# Patient Record
Sex: Male | Born: 1990 | Race: White | Hispanic: No | Marital: Single | Smoking: Never smoker
Health system: Southern US, Community
[De-identification: ages and names within clinical notes are randomized; demographics above are authoritative.]

## PROBLEM LIST (undated history)

## (undated) DIAGNOSIS — T7840XA Allergy, unspecified, initial encounter: Secondary | ICD-10-CM

## (undated) HISTORY — DX: Allergy, unspecified, initial encounter: T78.40XA

---
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
## 2008-01-23 IMAGING — CR DG FOREARM 2V*L*
1 series · 2 of 2 positions shown · non-contrast
Comparison: none

REASON FOR EXAM: pain
COMMENTS:

PROCEDURE:     KDR - KDXR FOREARM LEFT  - January 13, 2007  [DATE]
RESULT:     Comparison: No available comparison exam.

[Series 2: view not recorded · 0.17mm/px · 2 of 2 slices shown]
[im 1/2]
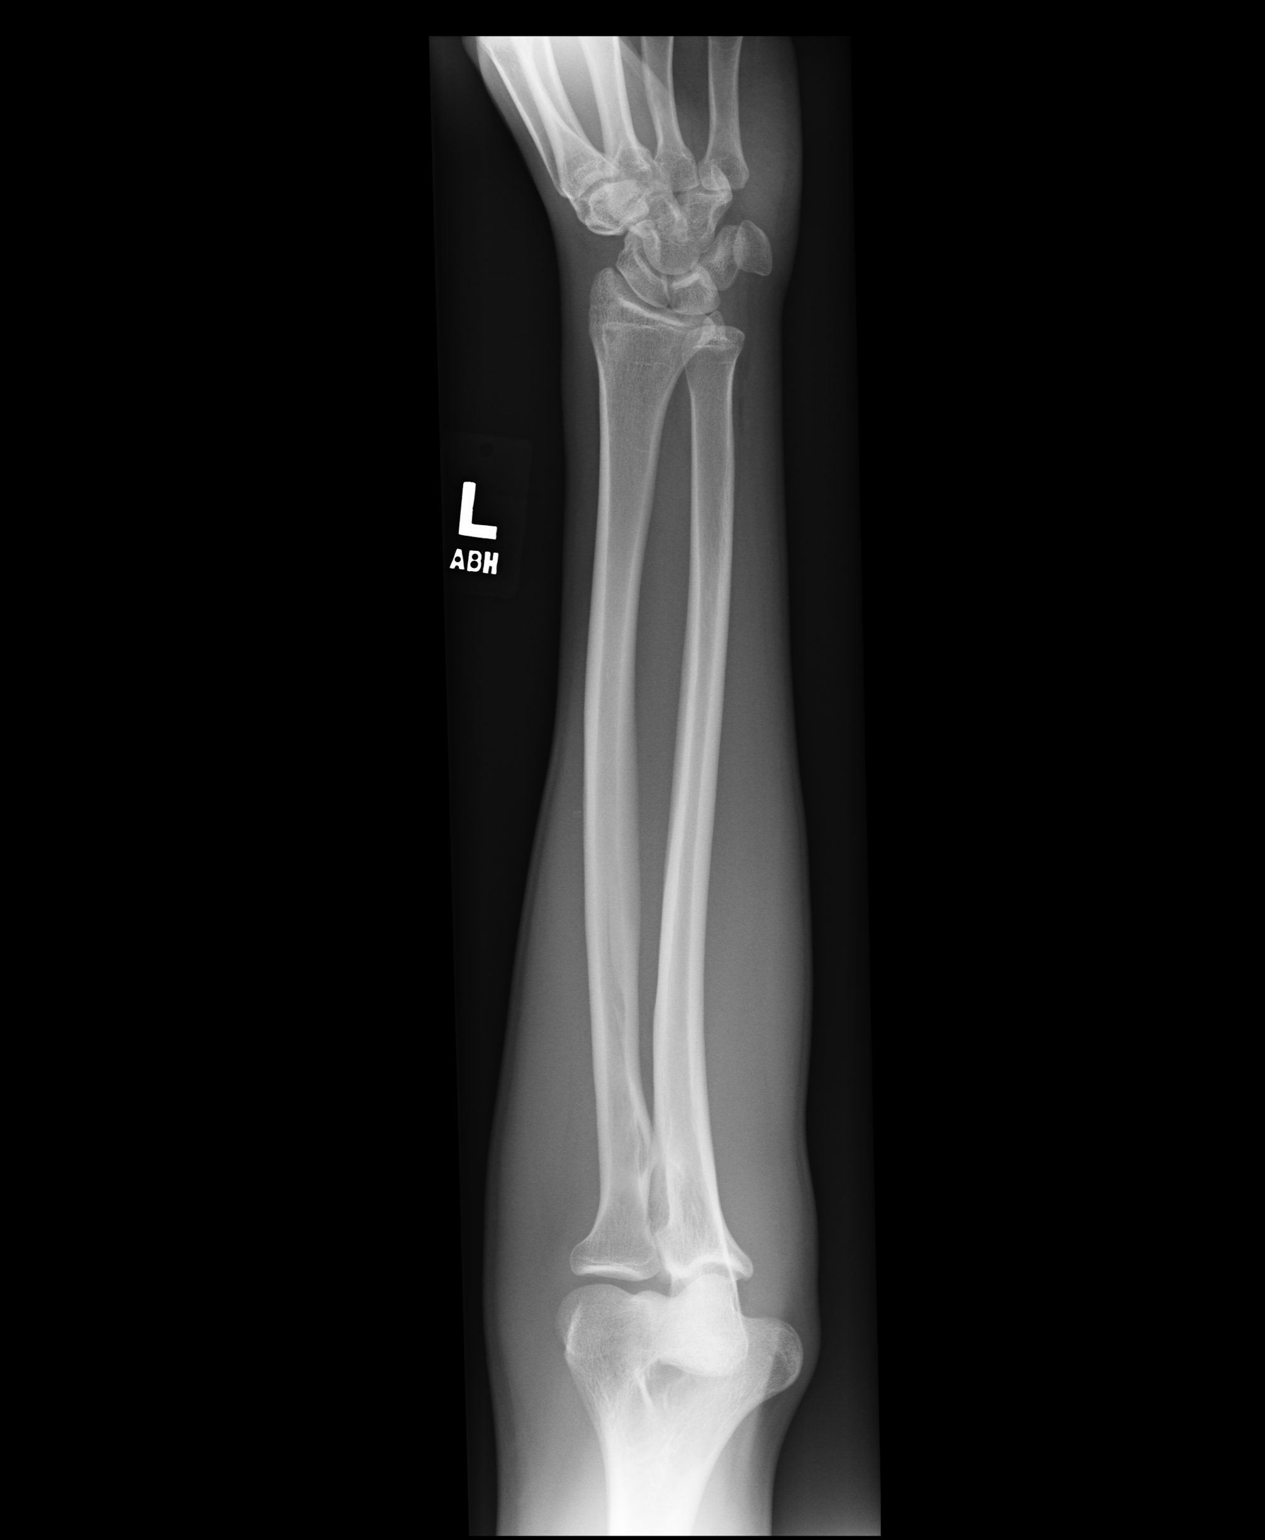
[im 2/2]
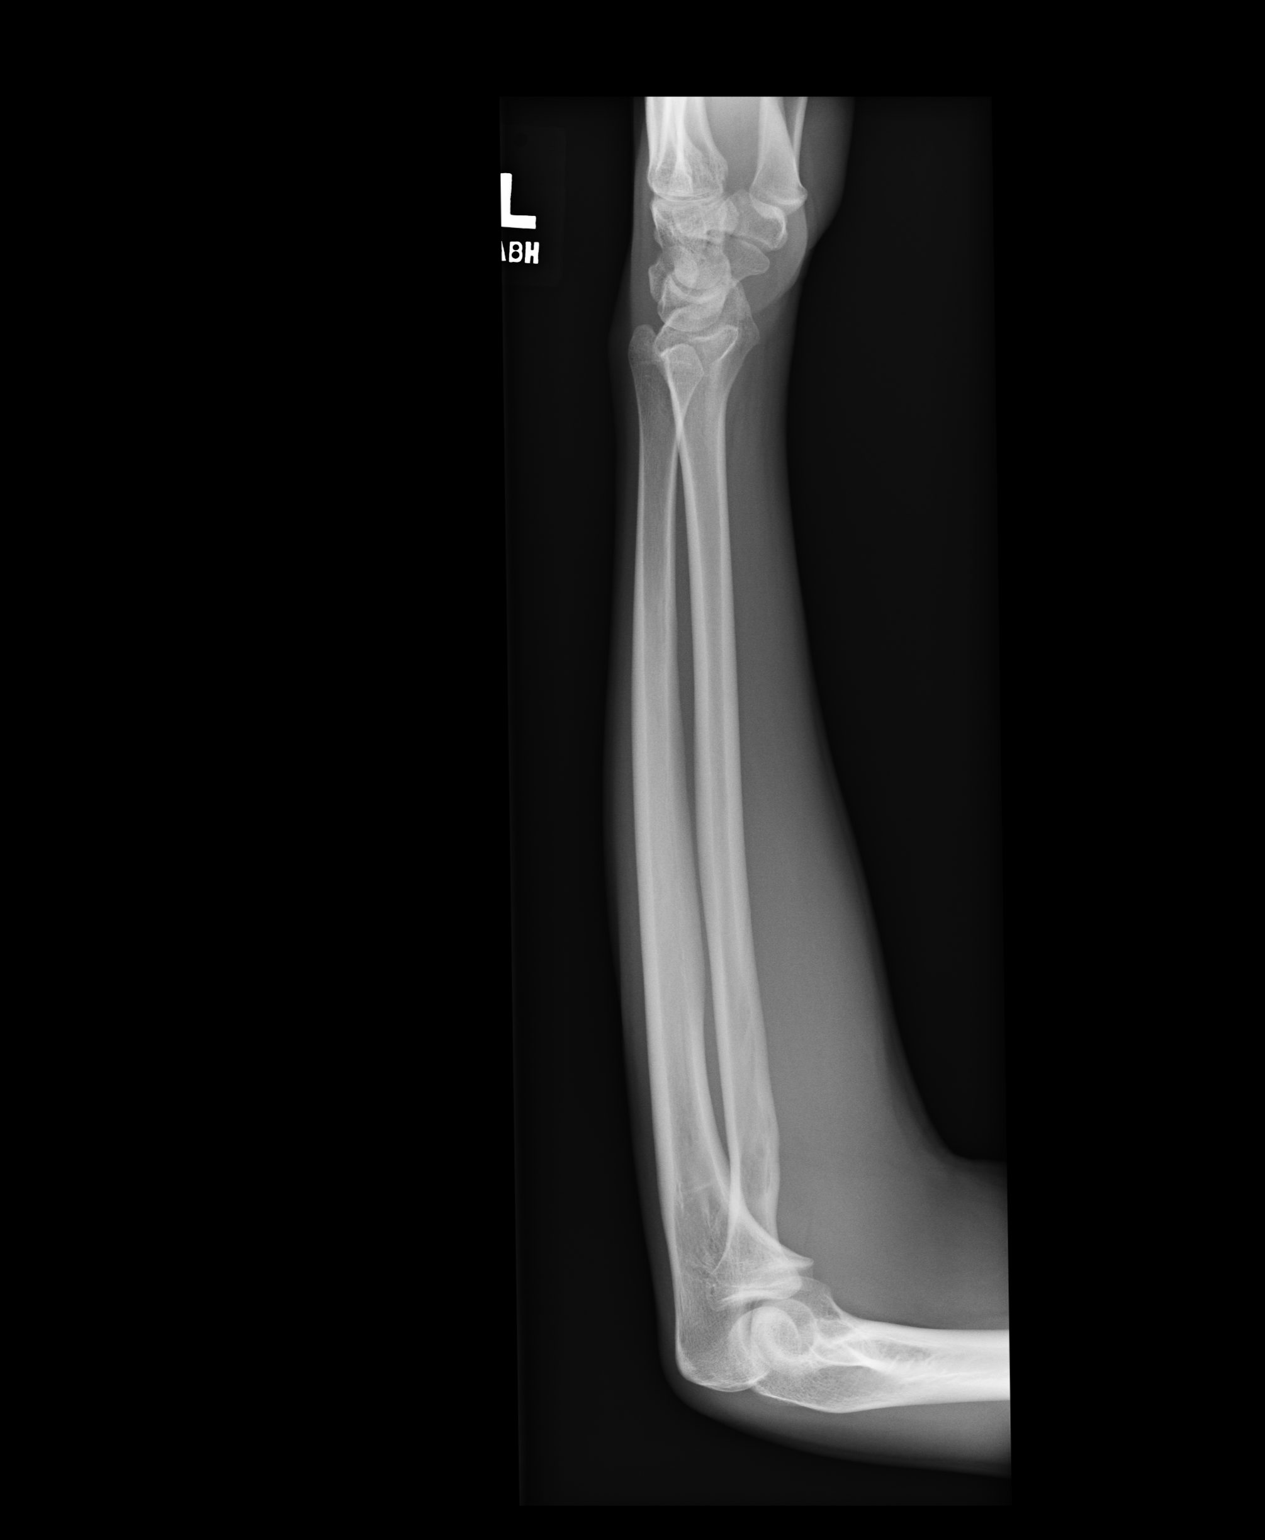

[2 of 2 positions shown; findings below may reference images not displayed]

FINDINGS: Two views of the left forearm were obtained.

No fracture or dislocation of the left forearm is noted.
IMPRESSION: 1. No fracture or dislocation of the left forearm is noted.

## 2008-11-08 IMAGING — US US THYROID
1 series · 17 of 25 positions shown · non-contrast
Comparison: none

REASON FOR EXAM: goiter
COMMENTS:

[Series 1: us thyroid · 17 of 27 slices shown]
[im 1/27]
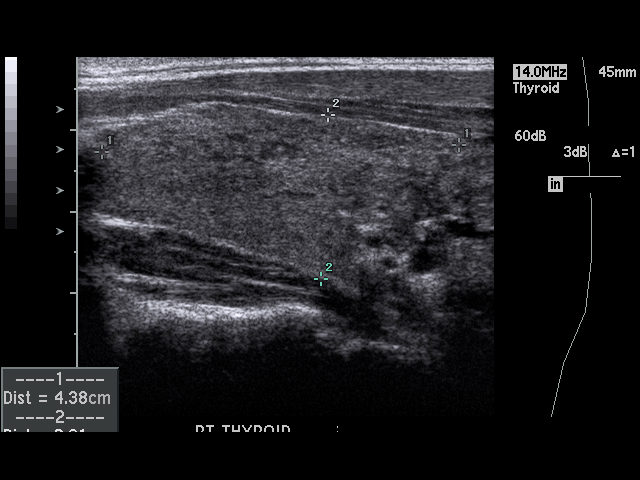
[im 3/27]
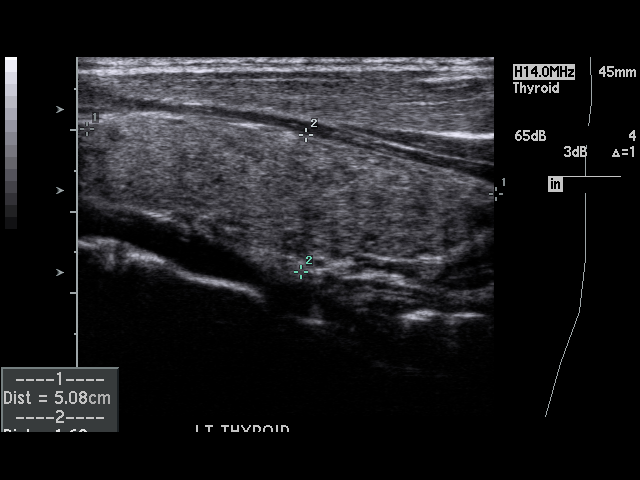
[im 4/27]
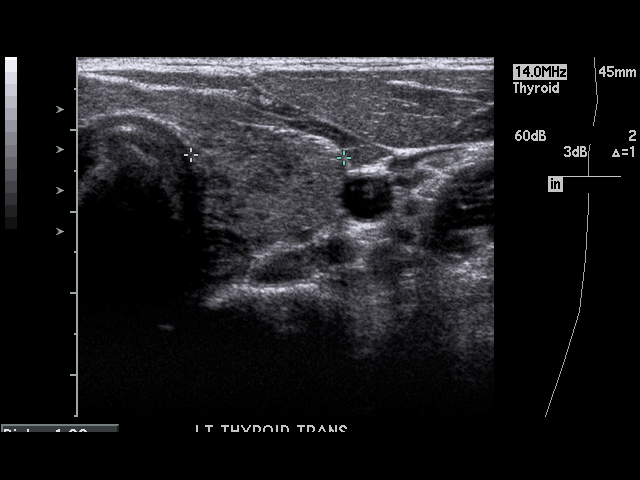
[im 6/27]
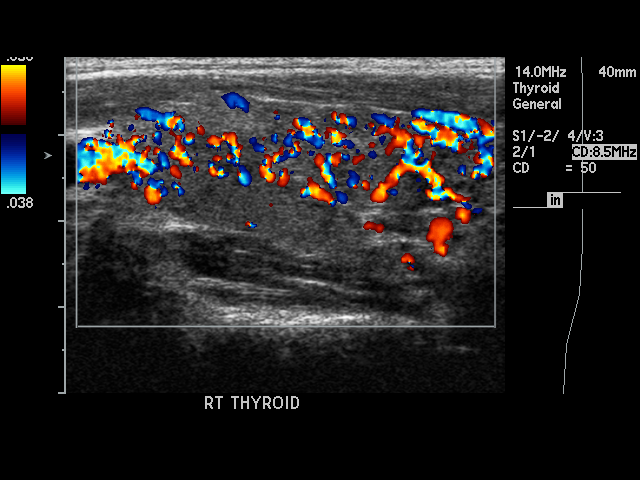
[im 7/27]
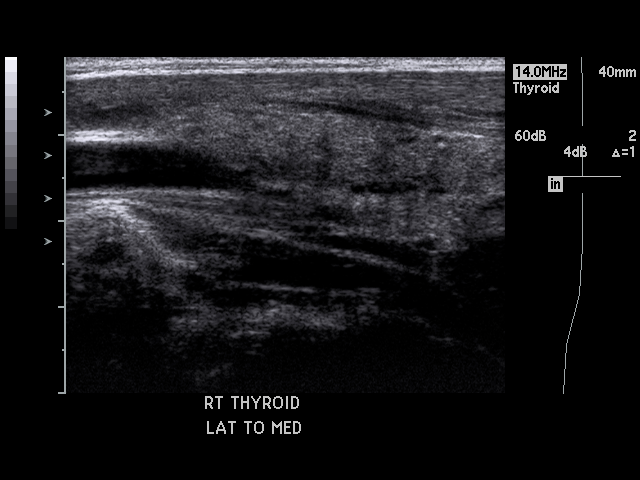
[im 9/27]
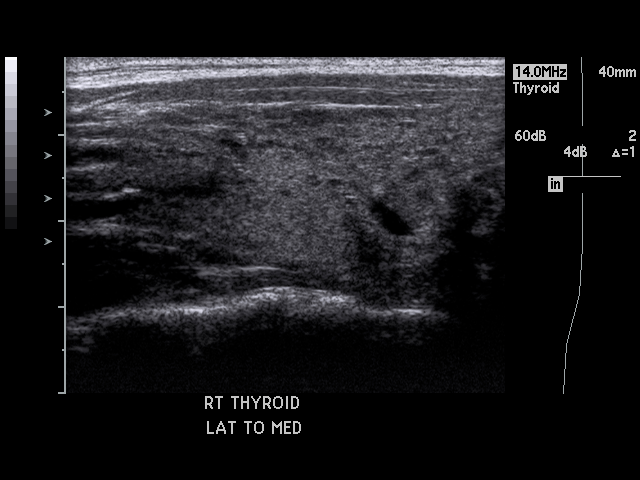
[im 10/27]
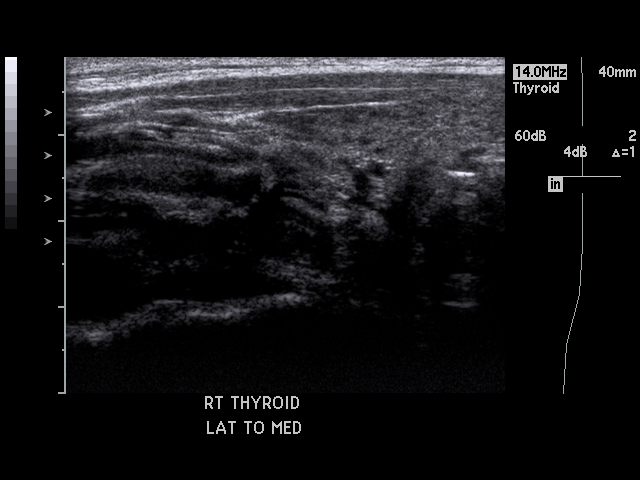
[im 12/27]
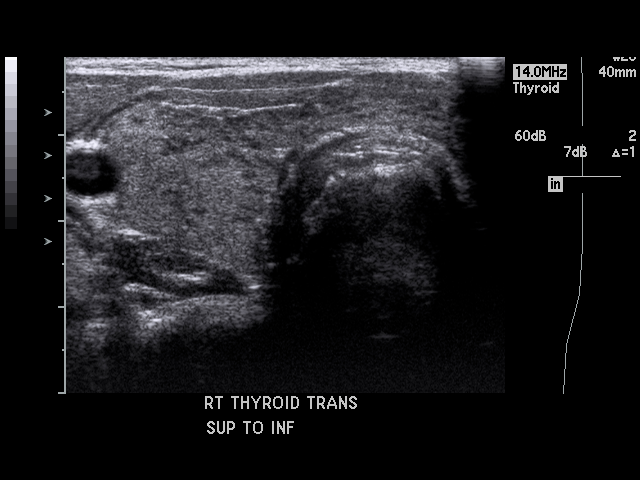
[im 14/27]
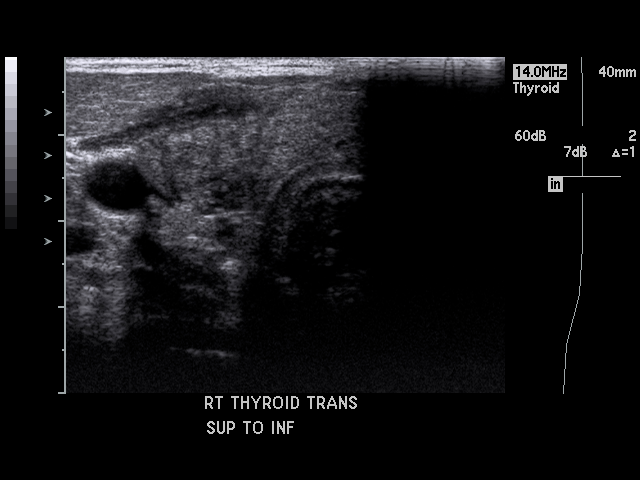
[im 15/27]
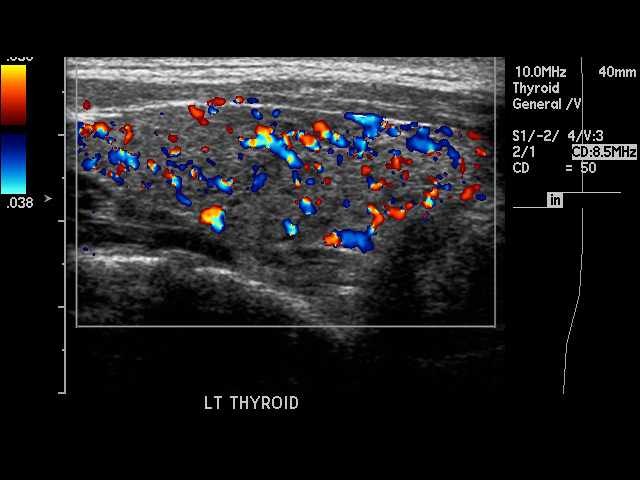
[im 17/27]
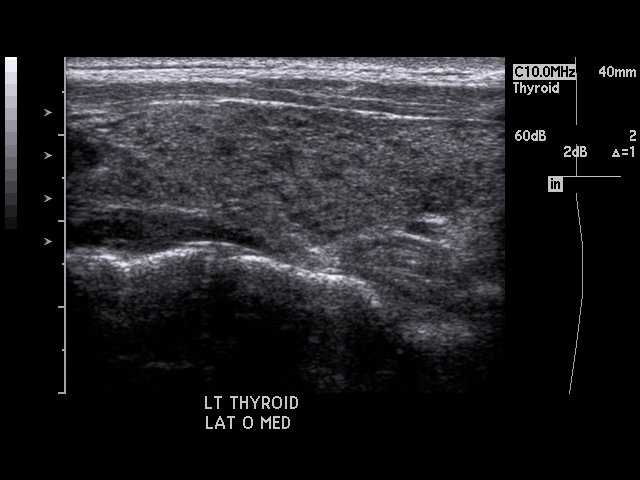
[im 18/27]
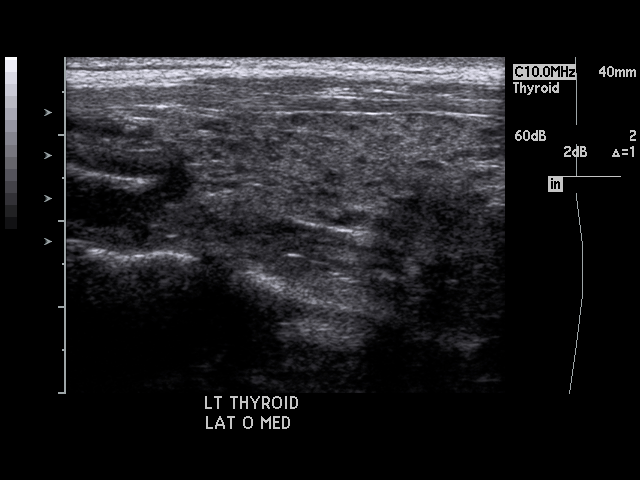
[im 20/27]
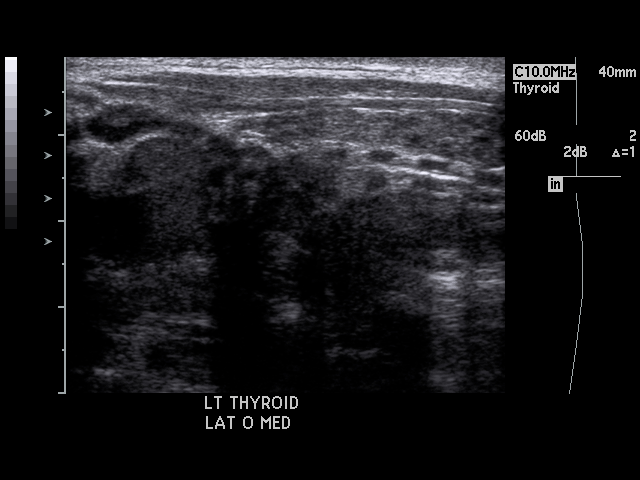
[im 21/27]
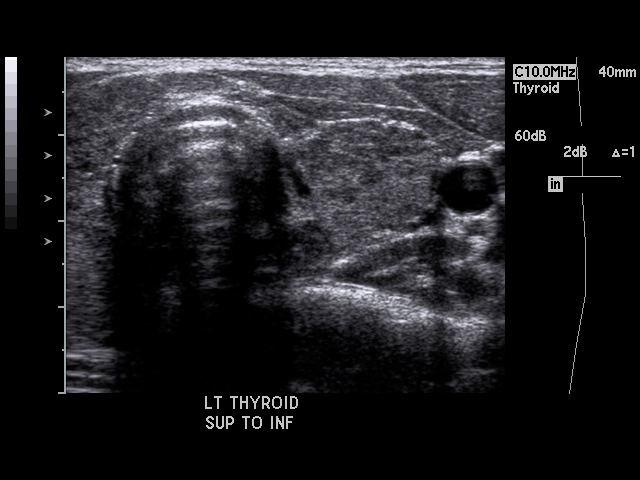
[im 23/27]
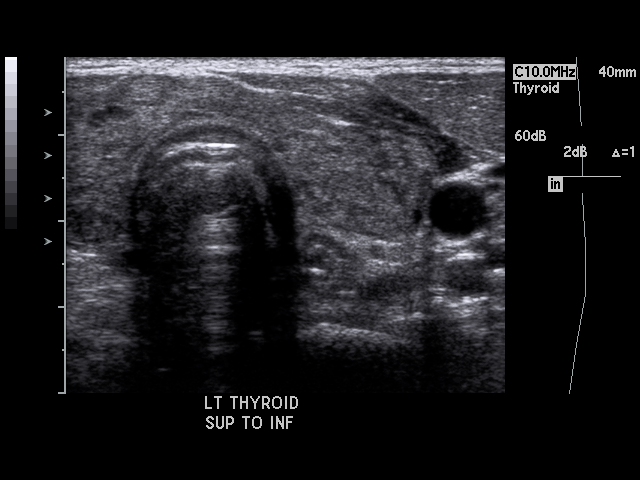
[im 24/27]
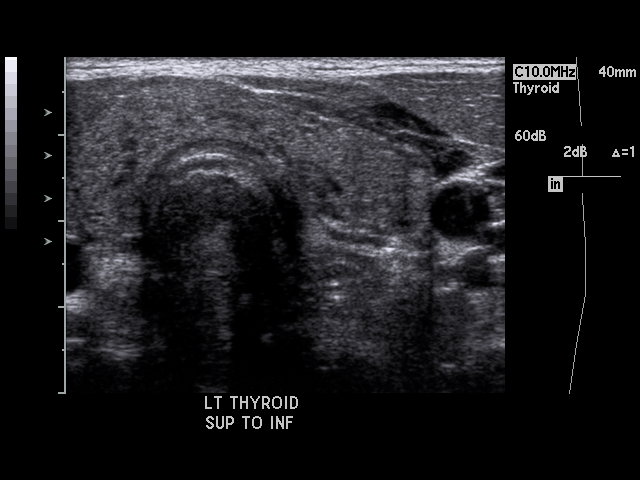
[im 27/27]
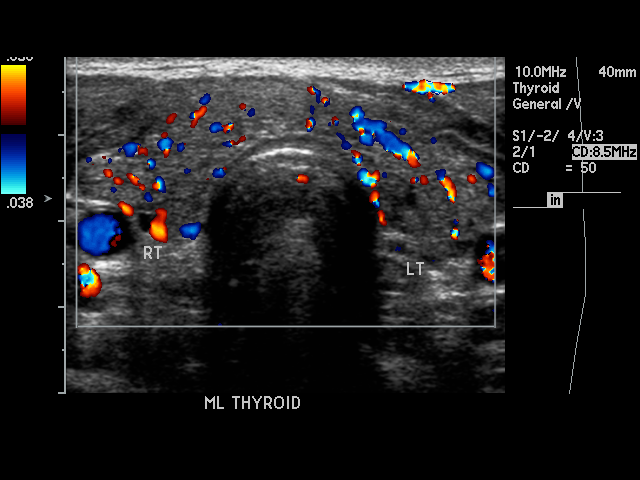

[17 of 25 positions shown; findings below may reference images not displayed]

PROCEDURE:     US  - US THYROID  - October 30, 2007 [DATE]

RESULT:     The thyroid gland is heterogeneous in echotexture with no
dominant nodules identified. The tissue is hypervascular. On the RIGHT, the
thyroid lobe measures 4.4 x 2.0 x 1.7 cm. On the LEFT, the lobe measures
x 1.7 x 1.9 cm. The thyroid isthmus measures 0.6 cm in diameter.
IMPRESSION: The thyroid gland is heterogeneous in its echotexture and
demonstrates increased vascularity. I see no dominant mass.

## 2010-10-07 ENCOUNTER — Emergency Department: Payer: Self-pay | Admitting: Emergency Medicine

## 2013-11-30 ENCOUNTER — Emergency Department: Payer: Self-pay | Admitting: Emergency Medicine

## 2014-11-17 ENCOUNTER — Emergency Department: Payer: Self-pay

## 2014-11-17 ENCOUNTER — Emergency Department
Admission: EM | Admit: 2014-11-17 | Discharge: 2014-11-17 | Disposition: A | Discharge status: Home / Self Care | Payer: Self-pay | Attending: Emergency Medicine | Admitting: Emergency Medicine

## 2014-11-17 DIAGNOSIS — I861 Scrotal varices: Secondary | ICD-10-CM

## 2014-11-17 DIAGNOSIS — S3994XA Unspecified injury of external genitals, initial encounter: Secondary | ICD-10-CM | POA: Insufficient documentation

## 2014-11-17 DIAGNOSIS — Z880 Allergy status to penicillin: Secondary | ICD-10-CM | POA: Insufficient documentation

## 2014-11-17 DIAGNOSIS — Y998 Other external cause status: Secondary | ICD-10-CM | POA: Insufficient documentation

## 2014-11-17 DIAGNOSIS — Y9289 Other specified places as the place of occurrence of the external cause: Secondary | ICD-10-CM | POA: Insufficient documentation

## 2014-11-17 DIAGNOSIS — E119 Type 2 diabetes mellitus without complications: Secondary | ICD-10-CM | POA: Insufficient documentation

## 2014-11-17 DIAGNOSIS — W2209XA Striking against other stationary object, initial encounter: Secondary | ICD-10-CM | POA: Insufficient documentation

## 2014-11-17 DIAGNOSIS — N50811 Right testicular pain: Secondary | ICD-10-CM

## 2014-11-17 DIAGNOSIS — Y9389 Activity, other specified: Secondary | ICD-10-CM | POA: Insufficient documentation

## 2014-11-17 NOTE — ED Provider Notes (Signed)
[facility] Emergency Department Provider Note ____________________________________________  Time seen: 1622  I have reviewed the triage vital signs and the nursing notes.  HISTORY  Chief Complaint  Groin Swelling  HPI Hector Marsh is a 24 y.o. male reports to the ED for evaluation of firmness to the right testicle after recent injury. He describes that about 6 days prior he was working under his car, when a 3/4 inch ratchet fell hitting him in the crotch. He noted immediate pain to the area, within the next few days began to note firmness and swelling to the right testicle. He is been taking Tylenol at appropriate intermittently for pain relief. He denies any dysuria, hematuria, bruising, or redness to the testicles. He is here today for evaluation of some ongoing right testicular fullness.He rates his pain at a 2/10 in triage.  Past Medical History  Diagnosis Date  . Diabetes mellitus without complication (HCC)    There are no active problems to display for this patient.  History reviewed. No pertinent past surgical history.  No current outpatient prescriptions on file.  Allergies Erythromycin and Penicillins  History reviewed. No pertinent family history.  Social History Social History  Substance Use Topics  . Smoking status: Never Smoker   . Smokeless tobacco: None  . Alcohol Use: No   Review of Systems  Constitutional: Negative for fever. Eyes: Negative for visual changes. ENT: Negative for sore throat. Cardiovascular: Negative for chest pain. Respiratory: Negative for shortness of breath. Gastrointestinal: Negative for abdominal pain, vomiting and diarrhea. Genitourinary: Negative for dysuria. Musculoskeletal: Negative for back pain. Skin: Negative for rash. Neurological: Negative for headaches, focal weakness or numbness. ____________________________________________  PHYSICAL EXAM:  VITAL SIGNS: ED Triage Vitals  Enc Vitals  Group     BP 11/17/14 1545 141/81 mmHg     Pulse Rate 11/17/14 1545 98     Resp 11/17/14 1545 16     Temp 11/17/14 1545 98.4 F (36.9 C)     Temp src --      SpO2 11/17/14 1545 98 %     Weight 11/17/14 1545 155 lb (70.308 kg)     Height 11/17/14 1545  (1.803 m)     Head Cir --      Peak Flow --      Pain Score 11/17/14 1546 2     Pain Loc --      Pain Edu? --      Excl. in GC? --    Constitutional: Alert and oriented. Well appearing and in no distress. Head: Normocephalic and atraumatic.      Eyes: Conjunctivae are normal. PERRL. Normal extraocular movements      Ears: Canals clear. TMs intact bilaterally.   Nose: No congestion/rhinorrhea.   Mouth/Throat: Mucous membranes are moist.   Neck: Supple. No thyromegaly. Hematological/Lymphatic/Immunological: No cervical lymphadenopathy. Cardiovascular: Normal rate, regular rhythm.  Respiratory: Normal respiratory effort. No wheezes/rales/rhonchi. Gastrointestinal: Soft and nontender. No distention. GU: Scotum without abrasion, laceration, ecchymosis, or lesion. The right testicle with a palpable, smooth firmness overlying the testis. No inguinal fullness or hernia appreciated.  Musculoskeletal: Nontender with normal range of motion in all extremities.  Neurologic:  Normal gait without ataxia. Normal speech and language. No gross focal neurologic deficits are appreciated. Skin:  Skin is warm, dry and intact. No rash noted. Psychiatric: Mood and affect are normal. Patient exhibits appropriate insight and judgment. ____________________________________________   RADIOLOGY Testicular Doppler Ultrasound  IMPRESSION: 1. No evidence of acute or subacute testicular  injury. 2. No evidence of testicular torsion. 3. No evidence of epididymo-orchitis. 4. Bilateral varicoceles, right greater than left, with prominent visible pampiniform plexus superior to the right  testicle. ____________________________________________  INITIAL IMPRESSION / ASSESSMENT AND PLAN / ED COURSE  Reassurance to the patient regarding the ultrasound findings. 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 He will be discharged with instructions to continue his ibuprofen and Tylenol dosing as well as ice compresses to promote healing. He is to follow with urology as needed for any ongoing symptoms. ____________________________________________  FINAL CLINICAL IMPRESSION(S) / ED DIAGNOSES  Final diagnoses:  Varicocele present on ultrasound of scrotum  Testicular pain, right      Lissa HoardJenise V Bacon Othelia Riederer, PA-C 11/18/14 0033  Darien Ramusavid W Kaminski, MD 11/22/14 1357

## 2014-11-17 NOTE — ED Notes (Signed)
Patient hit in testicles with ratchet from work last week. Patient comes in today with complaints of right testicle swelling and continued pain.

## 2014-11-17 NOTE — Discharge Instructions (Signed)
Varicocele A varicocele is a swelling of veins in the scrotum. The scrotum is the sac that contains the testicles. Varicoceles can occur on either side of the scrotum, but they are more common on the left side. They occur most often in teenage boys and young men. In most cases, varicoceles are not a serious problem. They are usually small and painless and do not require treatment. Tests may be done to confirm the diagnosis. Treatment may be needed if:  A varicocele is large, causes a lot of pain, or causes pain when exercising.  Varicoceles are found on both sides of the scrotum.  The testicle on the opposite side is absent or not normal.  A varicocele causes a decrease in the size of the testicle in a growing adolescent.  The person has fertility problems. CAUSES This condition is the result of valves in the veins not working properly. Valves in the veins help to return blood from the scrotum and testicles to the heart. If these valves do not work well, blood flows backward and backs up into the veins, which causes the veins to swell. This is similar to what happens when varicose veins form in the leg. SYMPTOMS Most varicoceles do not cause any symptoms. If symptoms do occur, they may include:  Swelling on one side of the scrotum. The swelling may be more obvious when you are standing up.  A lumpy feeling in the scrotum.  A heavy feeling on one side of the scrotum.  A dull ache in the scrotum, especially after exercise or prolonged standing or sitting.  Slower growth or reduced size of the testicle on the side of the varicocele (in young males).  Problems with fertility. These can occur if the testicle does not grow normally. DIAGNOSIS This condition may be diagnosed with a physical exam. You may also have an imaging test, called an ultrasound, to confirm the diagnosis and to help rule out other causes of the swelling. TREATMENT Treatment is usually not needed for this condition. If  you have any pain, your health care provider may prescribe or recommend medicine to help relieve it. You may need regular exams so your health care provider can monitor the varicocele to ensure that it does not cause problems. When further treatment is needed, it may involve one of these options:  Varicocelectomy. This is a surgery in which the swollen veins are tied off so that the flow of blood goes to other veins instead.  Embolization. In this procedure, a small tube (catheter) is used to place metal coils or other blocking items in the veins. This cuts off the blood flow to the swollen veins. HOME CARE INSTRUCTIONS  Take medicines only as directed by your health care provider.  Wear supportive underwear.  Use an athletic supporter for sports.  Keep all follow-up visits as directed by your health care provider. This is important. SEEK MEDICAL CARE IF:  Your pain is increasing.  You have redness in the affected area.  You have swelling that does not decrease when you are lying down.  One of your testicles is smaller than the other.  Your testicle becomes enlarged, swollen, or painful.   This information is not intended to replace advice given to you by your health care provider. Make sure you discuss any questions you have with your health care provider.   Document Released: 04/09/2000 Document Revised: 05/18/2014 Document Reviewed: 12/09/2013 Elsevier Interactive Patient Education 2016 Else[employer] Inc[employer]  Scrotal Swelling Scrotal swelling may occur  on one or both sides of the scrotum. Pain may also occur with swelling. Possible causes of scrotal swelling include:   Injury.  Infection.  An ingrown hair or abrasion in the area.  Repeated rubbing from tight-fitting underwear.  Poor hygiene.  A weakened area in the muscles around the groin (hernia). A hernia can allow abdominal contents to push into the scrotum.  Fluid around the testicle (hydrocele).  Enlarged vein around  the testicle (varicocele).  Certain medical treatments or existing conditions.  A recent genital surgery or procedure.  The spermatic cord becomes twisted in the scrotum, which cuts off blood supply (testicular torsion).  Testicular cancer. HOME CARE INSTRUCTIONS Once the cause of your scrotal swelling has been determined, you may be asked to monitor your scrotum for any changes. The following actions may help to alleviate any discomfort you are experiencing:  Rest and limit activity until the swelling goes away. Lying down is the preferred position.  Put ice on the scrotum:  Put ice in a plastic bag.  Place a towel between your skin and the bag.  Leave the ice on for 20 minutes, 2-3 times a day for 1-2 days.  Place a rolled towel under the testicles for support.  Wear loose-fitting clothing or an athletic support cup for comfort.  Take all medicines as directed by your health care provider.  Perform a monthly self-exam of the scrotum and penis. Feel for changes. Ask your health care provider how to perform a monthly self-exam if you are unsure. SEEK MEDICAL CARE IF:  You have a sudden (acute) onset of pain that is persistent and not improving.  You notice a heavy feeling or fluid in the scrotum.  You have pain or burning while urinating.  You have blood in the urine or semen.  You feel a lump around the testicle.  You notice that one testicle is larger than the other (slight variation is normal).  You have a persistent dull ache or pain in the groin or scrotum. SEEK IMMEDIATE MEDICAL CARE IF:  The pain does not go away or becomes severe.  You have a fever or shaking chills.  You have pain or vomiting that cannot be controlled.  You notice significant redness or swelling of one or both sides of the scrotum.  You experience redness spreading upward from your scrotum to your abdomen or downward from your scrotum to your thighs. MAKE SURE YOU:  Understand these  instructions.  Will watch your condition.  Will get help right away if you are not doing well or get worse.   This information is not intended to replace advice given to you by your health care provider. Make sure you discuss any questions you have with your health care provider.   Document Released: 02/03/2010 Document Revised: 09/03/2012 Document Reviewed: 06/05/2012 Elsevier Interactive Patient Education [employer]2016 [employer]  Your ultrasound shows a benign (common, not harmful) swelling to your testicle. You should continue to apply ice compresses and take pain medicine as needed. Follow-up with Dr. Sherron MondayMacDiarmid as needed.

## 2015-03-26 ENCOUNTER — Emergency Department: Payer: Self-pay

## 2015-03-26 ENCOUNTER — Emergency Department
Admission: EM | Admit: 2015-03-26 | Discharge: 2015-03-26 | Disposition: A | Discharge status: Home / Self Care | Payer: Self-pay | Attending: Emergency Medicine | Admitting: Emergency Medicine

## 2015-03-26 ENCOUNTER — Encounter: Payer: Self-pay | Admitting: Emergency Medicine

## 2015-03-26 DIAGNOSIS — A0811 Acute gastroenteropathy due to Norwalk agent: Secondary | ICD-10-CM | POA: Insufficient documentation

## 2015-03-26 DIAGNOSIS — A084 Viral intestinal infection, unspecified: Secondary | ICD-10-CM

## 2015-03-26 DIAGNOSIS — E119 Type 2 diabetes mellitus without complications: Secondary | ICD-10-CM | POA: Insufficient documentation

## 2015-03-26 MED ORDER — ONDANSETRON 4 MG PO TBDP: 4.0000 mg | ORAL_TABLET | Freq: Four times a day (QID) | ORAL | Status: DC | PRN

## 2015-03-26 MED ORDER — ONDANSETRON HCL 4 MG PO TABS
4.0000 mg | ORAL_TABLET | Freq: Once | ORAL | Status: AC
  Administered 2015-03-26: 4 mg via ORAL
  Filled 2015-03-26: qty 1

## 2015-03-26 NOTE — ED Provider Notes (Signed)
[facility] Emergency Department Provider Note ____________________________________________  Time seen: 1401  I have reviewed the triage vital signs and the nursing notes.  HISTORY  Chief Complaint  Cough  HPI Hector Marsh is a 25 y.o. male is a the ED for evaluation of symptoms include nausea, vomiting, and diarrhea for the last 2 days. The patient also reports today that he has had "a tough time breathing" today. He gets the sense of some heaviness and burning in the central part of his chest from the abdomen to the throat. He also describes some sore throat and cough in the last few days. He describes onset of high temperature Wednesday of 102.79F. Since that time been taking Tylenol on schedule for fever control. He denies receiving the seasonal flu vaccine. He describes his last oral intake was this morning at about 11 AM, when he was able to tolerate several sips of Powerade without nausea and vomiting. He denies any outright chest pain, wheezing, or headache.He reports his pain at a 9/10 in triage.  Past Medical History  Diagnosis Date  . Diabetes mellitus without complication (HCC)    There are no active problems to display for this patient.  History reviewed. No pertinent past surgical history.  Current Outpatient Rx  Name  Route  Sig  Dispense  Refill  . ondansetron (ZOFRAN ODT) 4 MG disintegrating tablet   Oral   Take 1 tablet (4 mg total) by mouth every 6 (six) hours as needed for nausea or vomiting.   15 tablet   0     Allergies Erythromycin and Penicillins  No family history on file.  Social History Social History  Substance Use Topics  . Smoking status: Never Smoker   . Smokeless tobacco: None  . Alcohol Use: No   Review of Systems  Constitutional: Negative for fever. Eyes: Negative for visual changes. ENT: Negative for sore throat. Cardiovascular: Negative for chest pain. Respiratory: Negative for shortness of  breath. Gastrointestinal: Positive for abdominal pain, vomiting and diarrhea. Genitourinary: Negative for dysuria. Musculoskeletal: Negative for back pain. Skin: Negative for rash. Neurological: Negative for headaches, focal weakness or numbness. ____________________________________________  PHYSICAL EXAM:  VITAL SIGNS: ED Triage Vitals  Enc Vitals Group     BP 03/26/15 1255 131/100 mmHg     Pulse Rate 03/26/15 1255 84     Resp 03/26/15 1255 18     Temp 03/26/15 1255 98.1 F (36.7 C)     Temp Source 03/26/15 1255 Oral     SpO2 03/26/15 1255 100 %     Weight 03/26/15 1255 155 lb (70.308 kg)     Height 03/26/15 1255  (1.803 m)     Head Cir --      Peak Flow --      Pain Score 03/26/15 1256 9     Pain Loc --      Pain Edu? --      Excl. in GC? --    Constitutional: Alert and oriented. Well appearing and in no distress. Head: Normocephalic and atraumatic.      Eyes: Conjunctivae are normal. PERRL. Normal extraocular movements      Ears: Canals clear. TMs intact bilaterally.   Nose: No congestion/rhinorrhea.   Mouth/Throat: Mucous membranes are moist.   Neck: Supple. No thyromegaly. Hematological/Lymphatic/Immunological: No cervical lymphadenopathy. Cardiovascular: Normal rate, regular rhythm.  Respiratory: Normal respiratory effort. No wheezes/rales/rhonchi. Gastrointestinal: Soft and nontender. No distention, rebound, guarding, organomegaly. Hyperactive bowel sounds noted. Musculoskeletal: Nontender with normal  range of motion in all extremities.  Neurologic:  Normal gait without ataxia. Normal speech and language. No gross focal neurologic deficits are appreciated. Skin:  Skin is warm, dry and intact. No rash noted. Psychiatric: Mood and affect are normal. Patient exhibits appropriate insight and judgment. ____________________________________________   RADIOLOGY CXR IMPRESSION: Negative two view chest  x-ray ____________________________________________  PROCEDURES  Zofran 4 mg PO ____________________________________________  INITIAL IMPRESSION / ASSESSMENT AND PLAN / ED COURSE  Patient with stable vital signs and clinical exam that seemed consistent with an acute GI viral infection. Chest x-ray is negative for any acute cardiopulmonary process. Patient reports improvement of his abdominal pain after the administration of Zofran. Patient will be discharged with a prescription for Zofran as well as instructions on dosing over-the-counter antacids as needed for abdominal pain. He presented to monitor symptoms and increase fluid intake to reduce chance of dehydration. He'll follow-up with his primary care provider over the local clinics for ongoing symptom management. Return to the ED if ED for acutely worsening symptoms. ____________________________________________  FINAL CLINICAL IMPRESSION(S) / ED DIAGNOSES  Final diagnoses:  Viral gastroenteritis  [place] virus enteritis      Lissa HoardJenise V Bacon Jamori Biggar, PA-C 03/26/15 1515  Jennye MoccasinBrian S Quigley, MD 03/26/15 1549

## 2015-03-26 NOTE — ED Notes (Signed)
Cough and body aches x 2 days.  

## 2015-03-26 NOTE — Discharge Instructions (Signed)
Norovirus Infection A norovirus infection is caused by exposure to a virus in a group of similar viruses (noroviruses). This type of infection causes inflammation in your stomach and intestines (gastroenteritis). Norovirus is the most common cause of gastroenteritis. It also causes food poisoning. Anyone can get a norovirus infection. It spreads very easily (contagious). You can get it from contaminated food, water, surfaces, or other people. Norovirus is found in the stool or vomit of infected people. You can spread the infection as soon as you feel sick until 2 weeks after you recover.  Symptoms usually begin within 2 days after you become infected. Most norovirus symptoms affect the digestive system. CAUSES Norovirus infection is caused by contact with norovirus. You can catch norovirus if you:  Eat or drink something contaminated with norovirus.  Touch surfaces or objects contaminated with norovirus and then put your hand in your mouth.  Have direct contact with an infected person who has symptoms.  Share food, drink, or utensils with someone with who is sick with norovirus. SIGNS AND SYMPTOMS Symptoms of norovirus may include:  Nausea.  Vomiting.  Diarrhea.  Stomach cramps.  Fever.  Chills.  Headache.  Muscle aches.  Tiredness. DIAGNOSIS Your health care provider may suspect norovirus based on your symptoms and physical exam. Your health care provider may also test a sample of your stool or vomit for the virus.  TREATMENT There is no specific treatment for norovirus. Most people get better without treatment in about 2 days. HOME CARE INSTRUCTIONS  Replace lost fluids by drinking plenty of water or rehydration fluids containing important minerals called electrolytes. This prevents dehydration. Drink enough fluid to keep your urine clear or pale yellow.  Do not prepare food for others while you are infected. Wait at least 3 days after recovering from the illness to do  that. PREVENTION   Wash your hands often, especially after using the toilet or changing a diaper.  Wash fruits and vegetables thoroughly before preparing or serving them.  Throw out any food that a sick person may have touched.  Disinfect contaminated surfaces immediately after someone in the household has been sick. Use a bleach-based household cleaner.  Immediately remove and wash soiled clothes or sheets. SEEK MEDICAL CARE IF:  Your vomiting, diarrhea, and stomach pain is getting worse.  Your symptoms of norovirus do not go away after 2-3 days. SEEK IMMEDIATE MEDICAL CARE IF:  You develop symptoms of dehydration that do not improve with fluid replacement. This may include:  Excessive sleepiness.  Lack of tears.  Dry mouth.  Dizziness when standing.  Weak pulse.   This information is not intended to replace advice given to you by your health care provider. Make sure you discuss any questions you have with your health care provider.   Document Released: 03/24/2002 Document Revised: 01/22/2014 Document Reviewed: 06/11/2013 Elsevier Interactive Patient Education 2016 [place] Choices to Help Relieve Diarrhea, Adult When you have diarrhea, the foods you eat and your eating habits are very important. Choosing the right foods and drinks can help relieve diarrhea. Also, because diarrhea can last up to 7 days, you need to replace lost fluids and electrolytes (such as sodium, potassium, and chloride) in order to help prevent dehydration.  WHAT GENERAL GUIDELINES DO I NEED TO FOLLOW?  Slowly drink 1 cup (8 oz) of fluid for each episode of diarrhea. If you are getting enough fluid, your urine will be clear or pale yellow.  Eat starchy foods. Some good choices  include white rice, white toast, pasta, low-fiber cereal, baked potatoes (without the skin), saltine crackers, and bagels.  Avoid large servings of any cooked vegetables.  Limit fruit to two servings per day. A  serving is  cup or 1 small piece.  Choose foods with less than 2 g of fiber per serving.  Limit fats to less than 8 tsp (38 g) per day.  Avoid fried foods.  Eat foods that have probiotics in them. Probiotics can be found in certain dairy products.  Avoid foods and beverages that may increase the speed at which food moves through the stomach and intestines (gastrointestinal tract). Things to avoid include:  High-fiber foods, such as dried fruit, raw fruits and vegetables, nuts, seeds, and whole grain foods.  Spicy foods and high-fat foods.  Foods and beverages sweetened with high-fructose corn syrup, honey, or sugar alcohols such as xylitol, sorbitol, and mannitol. WHAT FOODS ARE RECOMMENDED? Grains White rice. White, [place], or pita breads (fresh or toasted), including plain rolls, buns, or bagels. White pasta. Saltine, soda, or graham crackers. Pretzels. Low-fiber cereal. Cooked cereals made with water (such as cornmeal, farina, or cream cereals). Plain muffins. Matzo. Melba toast. Zwieback.  Vegetables Potatoes (without the skin). Strained tomato and vegetable juices. Most well-cooked and canned vegetables without seeds. Tender lettuce. Fruits Cooked or canned applesauce, apricots, cherries, fruit cocktail, grapefruit, peaches, pears, or plums. Fresh bananas, apples without skin, cherries, grapes, cantaloupe, grapefruit, peaches, oranges, or plums.  Meat and Other Protein Products Baked or boiled chicken. Eggs. Tofu. Fish. Seafood. Smooth peanut butter. Ground or well-cooked tender beef, ham, veal, lamb, pork, or poultry.  Dairy Plain yogurt, kefir, and unsweetened liquid yogurt. Lactose-free milk, buttermilk, or soy milk. Plain hard cheese. Beverages Sport drinks. Clear broths. Diluted fruit juices (except prune). Regular, caffeine-free sodas such as ginger ale. Water. Decaffeinated teas. Oral rehydration solutions. Sugar-free beverages not sweetened with sugar  alcohols. Other Bouillon, broth, or soups made from recommended foods.  The items listed above may not be a complete list of recommended foods or beverages. Contact your dietitian for more options. WHAT FOODS ARE NOT RECOMMENDED? Grains Whole grain, whole wheat, bran, or rye breads, rolls, pastas, crackers, and cereals. Wild or brown rice. Cereals that contain more than 2 g of fiber per serving. Corn tortillas or taco shells. Cooked or dry oatmeal. Granola. Popcorn. Vegetables Raw vegetables. Cabbage, broccoli, Brussels sprouts, artichokes, baked beans, beet greens, corn, kale, legumes, peas, sweet potatoes, and yams. Potato skins. Cooked spinach and cabbage. Fruits Dried fruit, including raisins and dates. Raw fruits. Stewed or dried prunes. Fresh apples with skin, apricots, mangoes, pears, raspberries, and strawberries.  Meat and Other Protein Products Chunky peanut butter. Nuts and seeds. Beans and lentils. Berniece Salines.  Dairy High-fat cheeses. Milk, chocolate milk, and beverages made with milk, such as milk shakes. Cream. Ice cream. Sweets and Desserts Sweet rolls, doughnuts, and sweet breads. Pancakes and waffles. Fats and Oils Butter. Cream sauces. Margarine. Salad oils. Plain salad dressings. Olives. Avocados.  Beverages Caffeinated beverages (such as coffee, tea, soda, or energy drinks). Alcoholic beverages. Fruit juices with pulp. Prune juice. Soft drinks sweetened with high-fructose corn syrup or sugar alcohols. Other Coconut. Hot sauce. Chili powder. Mayonnaise. Gravy. Cream-based or milk-based soups.  The items listed above may not be a complete list of foods and beverages to avoid. Contact your dietitian for more information. WHAT SHOULD I DO IF I BECOME DEHYDRATED? Diarrhea can sometimes lead to dehydration. Signs of dehydration include dark urine and dry  mouth and skin. If you think you are dehydrated, you should rehydrate with an oral rehydration solution. These solutions can be  purchased at pharmacies, retail stores, or online.  Drink -1 cup (120-240 mL) of oral rehydration solution each time you have an episode of diarrhea. If drinking this amount makes your diarrhea worse, try drinking smaller amounts more often. For example, drink 1-3 tsp (5-15 mL) every 5-10 minutes.  A general rule for staying hydrated is to drink 1-2 L of fluid per day. Talk to your health care provider about the specific amount you should be drinking each day. Drink enough fluids to keep your urine clear or pale yellow.   This information is not intended to replace advice given to you by your health care provider. Make sure you discuss any questions you have with your health care provider.   Document Released: 03/24/2003 Document Revised: 01/22/2014 Document Reviewed: 11/24/2012 Elsevier Interactive Patient Education 2016 Elsevier Inc.   Continue to monitor symptoms and increase fluids to prevent dehydration. Follow-up with your provider for continued symptoms.

## 2016-02-22 ENCOUNTER — Encounter: Payer: Self-pay | Admitting: Family Medicine

## 2016-02-22 ENCOUNTER — Ambulatory Visit (INDEPENDENT_AMBULATORY_CARE_PROVIDER_SITE_OTHER): Payer: BLUE CROSS/BLUE SHIELD | Admitting: Family Medicine

## 2016-02-22 VITALS — BP 122/86 | HR 91 | Temp 98.2°F | Resp 16 | Ht 71.0 in | Wt 145.2 lb

## 2016-02-22 DIAGNOSIS — Z114 Encounter for screening for human immunodeficiency virus [HIV]: Secondary | ICD-10-CM

## 2016-02-22 DIAGNOSIS — E1040 Type 1 diabetes mellitus with diabetic neuropathy, unspecified: Secondary | ICD-10-CM | POA: Diagnosis not present

## 2016-02-22 DIAGNOSIS — E038 Other specified hypothyroidism: Secondary | ICD-10-CM

## 2016-02-22 DIAGNOSIS — Z7689 Persons encountering health services in other specified circumstances: Secondary | ICD-10-CM | POA: Diagnosis not present

## 2016-02-22 DIAGNOSIS — E063 Autoimmune thyroiditis: Secondary | ICD-10-CM

## 2016-02-22 DIAGNOSIS — M545 Low back pain, unspecified: Secondary | ICD-10-CM | POA: Insufficient documentation

## 2016-02-22 DIAGNOSIS — IMO0002 Reserved for concepts with insufficient information to code with codable children: Secondary | ICD-10-CM

## 2016-02-22 DIAGNOSIS — G8929 Other chronic pain: Secondary | ICD-10-CM | POA: Diagnosis not present

## 2016-02-22 DIAGNOSIS — E1065 Type 1 diabetes mellitus with hyperglycemia: Secondary | ICD-10-CM

## 2016-02-22 MED ORDER — GLUCAGON (RDNA) 1 MG IJ KIT: PACK | INTRAMUSCULAR | 1 refills | Status: AC

## 2016-02-22 MED ORDER — ACETONE (URINE) TEST VI STRP: ORAL_STRIP | 1 refills | Status: AC

## 2016-02-22 NOTE — Assessment & Plan Note (Signed)
Subacute on chronic L LBP without associated sciatica. Suspect likely due to muscle spasm/strain, without known injury or trauma. Likely with prolonged standing at work, no known underlying DJD or OA - No red flag symptoms. Negative SLR for radiculopathy - Inadequate conservative therapy  Plan: 1. Reviewed safe dosing Tylenol, up to 1g TID PRN or regular dosing 2. Agree hold NSAIDs for now, can use sparingly if needed 3. Heating pad, conservative therapy, and activity modification 4. Offered muscle relaxant Baclofen PRN, but declined for now, consider in future 5. Future consider Lumbar X-rays for baseline, given problem >1 year, some recent worsening, possible PT in future if needed

## 2016-02-22 NOTE — Progress Notes (Addendum)
Subjective:    Patient ID: Hector Marsh, male    DOB: 07/12/1990, 26 y.o.   MRN: [id number]  Hector Marsh is a 26 y.o. male presenting on 02/22/2016 for Establish Care  No prior established PCP for past 7+ years, has not had insurance until recently, now has BC[employer], here as new patient to get established.  HPI   CHRONIC DM, Type 1, Uncontrolled: - Reports chronic history of Type 1 DM diagnosed at age 26, he was eventually transitioned to basal/bolus insulin with Lantus and Novolog for several years, [place], but for past 5 years he has been without insurance coverage and unable to follow-up with any doctor (PCP or Endocrine) and he has been on OTC insulin regimen with Novoloin-R, he admits difficulty controlling his diabetes due to cost - CBGs: has purchased OTC Glucometer, only checks occasionally if feeling bad difficulty affording test strips, for past few months avg 200-300s Meds: Currently on Novoloin-R 10 units 4-5 times daily, with food, and will eat about 1-2 regular meals a day Not on ACEi / ARB Lifestyle: - Diet (admits erratic diet with only 1-2 regular meals daily, otherwise will eat frequent snacks and smaller meals 4-5 times a day, even overnight to avoid hypoglycemia and will take insulin with food, tries to follow DM diet but not always adhering to this and not counting carbs) - Exercise (recently resumed regular exercise with bike riding) - Admits concern for DM neuropathy with bilateral tingling and numbness in soles of feet, not always can have worsening flares with elevated sugar, never on gabapentin or other medicine - No significant worsening vision. S/p bilateral cataract surgery lens replacement (2007-2008) - Admits rare hypoglycemia 2-3x over past few months, usually worse at night, has low of 42, improve quickly with food, does not have glucagon anymore, used this when younger - Today he is requesting referral to  Endocrinology Denies recent hypoglycemia or hyperglycemia with DKA (has had this in the past, requesting refill ketone urine strips)  H/o Hypothyroidism, Hashimoto's in setting of DM1 - Prior history of dx Hashimoto >5-10 years ago and treated with levothyroxine for 6 months then was told this problem had resolved and his medicine was stopped. Has not had any further intervention on this no recent lab checks.  LOW BACK PAIN, Chronic - Reports symptoms started about 1-2 years ago with intermittent low back pain with muscle spasms, without any known inciting injury. - Recently seems to be worsening over past 1 month. Describes pain as aching or stiffness "tight muscles" left worse than right mild to moderate severity. No pain radiating to legs. Worse prolonged standing at work. - Taking Tylenol x 2-3 tabs a day PRN with some relief. He was advised against taking NSAIDs in past due to DM - Tried heating pad with some relief - No known history of lumbar OA/DJD, no prior back surgery - Denies any injury trauma or fall, weakness, loss of control bladder/bowel incontinence or retention, unintentional wt loss, night sweats  Health Maintenance: - Due for routine HIV screen, will get today - UTD Tdap approx 01/2011, next due 2023 - Did not receive Flu vaccine this season, declines - Will be due for Pneumonia vaccine in setting of DM if has not received yet  [facility]  Decreased Interest 0  Down, Depressed, Hopeless 0  PHQ - 2 Score 0   Past Medical History:  Diagnosis Date  . Allergy    Past Surgical History:  Procedure Laterality Date  . CATARACT EXTRACTION W/ INTRAOCULAR LENS IMPLANT Bilateral 2008   Social History   Social History  . Marital status: Single    Spouse name: N/A  . Number of children: N/A  . Years of education: High School   Occupational History  . 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 Smoking status: Never  Smoker  . Smokeless tobacco: Never Used  . Alcohol use No  . Drug use: No  . Sexual activity: Not on file   Other Topics Concern  . Not on file   Social History Narrative  . No narrative on file   Family History  Problem Relation Age of Onset  . Bipolar disorder Mother   . Diabetes Father   . Heart disease Maternal Grandmother   . Diabetes Maternal Grandmother   . Thyroid disease Maternal Grandmother   . Heart disease Maternal Grandfather    No current outpatient prescriptions on file prior to visit.   No current facility-administered medications on file prior to visit.     Review of Systems  Constitutional: Negative for activity change, appetite change, chills, diaphoresis, fatigue, fever and unexpected weight change.  HENT: Negative for congestion, hearing loss and sinus pressure.   Eyes: Negative for visual disturbance.  Respiratory: Negative for cough, chest tightness, shortness of breath and wheezing.   Cardiovascular: Negative for chest pain, palpitations and leg swelling.  Gastrointestinal: Negative for abdominal pain, blood in stool, constipation, diarrhea, nausea and vomiting.  Endocrine: Positive for polydipsia and polyuria. Negative for cold intolerance.       Hypoglycemia, rarely  Genitourinary: Negative for decreased urine volume, difficulty urinating, dysuria, frequency and hematuria.  Musculoskeletal: Positive for back pain. Negative for arthralgias and neck pain.  Skin: Negative for rash.  Allergic/Immunologic: Negative for environmental allergies.  Neurological: Negative for dizziness, weakness, light-headedness, numbness and headaches.       Bilateral foot tingling intermittently  Hematological: Negative for adenopathy.  Psychiatric/Behavioral: Negative for behavioral problems, dysphoric mood and sleep disturbance. The patient is not nervous/anxious.    Per HPI unless specifically indicated above     Objective:    BP 122/86 (BP Location: Left Arm,  Cuff Size: Normal)   Pulse 91   Temp 98.2 F (36.8 C) (Oral)   Resp 16   Ht 5\' 11"  (1.803 m)   Wt 145 lb 3.2 oz (65.9 kg)   BMI 20.25 kg/m   Wt Readings from Last 3 Encounters:  02/22/16 145 lb 3.2 oz (65.9 kg)  03/26/15 155 lb (70.3 kg)  11/17/14 155 lb (70.3 kg)    Physical Exam  Constitutional: He is oriented to person, place, and time. He appears well-developed and well-nourished. No distress.  Well-appearing, comfortable, cooperative, thin appearing  HENT:  Head: Normocephalic and atraumatic.  Mouth/Throat: Oropharynx is clear and moist.  Nares patent with mild congestion without edema. Bilateral TMs clear without erythema, effusion or bulging. Oropharynx clear without erythema, exudates, edema or asymmetry.  Eyes: Conjunctivae and EOM are normal. Pupils are equal, round, and reactive to light.  Neck: Normal range of motion. Neck supple. Thyromegaly (mild fullness of thyroid without focal nodule or tenderness) present.  Cardiovascular: Normal rate, regular rhythm, normal heart sounds and intact distal pulses.   No murmur heard. Pulmonary/Chest: Effort normal and breath sounds normal. No respiratory distress. He has no wheezes. He has no rales.  Abdominal: Soft. Bowel sounds are normal. He exhibits no distension and no mass. There is  no tenderness. There is no rebound and no guarding.  Musculoskeletal: Normal range of motion. He exhibits no edema.  Low Back Inspection: Normal appearance, no spinal deformity, symmetrical. Palpation: No tenderness over spinous processes. Bilateral lumbar paraspinal muscles non-tender and without hypertonicity/spasm, localized area of tenderness to Left lower lumbar paraspinal but no reproduced pain today ROM: Full active ROM forward flex / back extension, rotation L/R without discomfort Special Testing: Seated SLR negative for radicular pain bilaterally  Standing facet load test negative Strength: Bilateral hip flex/ext 5/5, knee flex/ext 5/5,  ankle dorsiflex/plantarflex 5/5 Neurovascular: intact distal sensation to light touch  Lymphadenopathy:    He has no cervical adenopathy.  Neurological: He is alert and oriented to person, place, and time.  Distal sensation to light touch intact upper extremity and lower extremity.  Skin: Skin is warm and dry. No rash noted. He is not diaphoretic.  Psychiatric: He has a normal mood and affect. His behavior is normal.  Well groomed, good eye contact, normal speech and thoughts  Nursing note and vitals reviewed.  No results found for this or any previous visit.    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 Concern with infrequent CBG readings elevated >200-300, likely complication by history of some early DM neuropathy, and s/p bilateral cataract surgery. - Reassuring with infrequent hypoglycemia, but concern he does not have glucagon, no recent episodes of DKA or severe hypoglycemia  Plan: 1. Discussion today on Diabetes, progression, complications, management, he is aware of hypoglycemia plan 2. Rx Glucagon 1mg  pen for emergency use 3. Rx Acetone urine ketone test strips 4. Urgent Referral placed to Endocrinology [facility] [address] [facility]) to establish care for chronic management of T1DM, previously he was on insulin pump before losing insurance and history of better control on basal/bolus dosing, mutual agreement to discuss new treatment plan with Endocrine and no change today, but offered if needed we could transition him with some basal insulin if needed. Called [facility] to discuss case and notified them, they will anticipate referral and will try to work patient in sooner 5. Check basic labs, CMET,  Lipids, A1c, TSH in setting of DM 6. Follow-up within 3 months as needed, return criteria reviewed      Relevant Medications   insulin regular (NOVOLIN R,HUMULIN R) 250 units/2.32mL (100 units/mL) injection   glucagon 1 MG injection   acetone, urine, test strip   Other Relevant Orders   COMPLETE METABOLIC PANEL WITH GFR   Lipid panel   Hemoglobin A1c   Ambulatory referral to Endocrinology   Hypothyroidism due to Hashimoto's thyroiditis    Suspected secondary hypothyroidism with hashimoto in setting of DM1, prior diagnosis, but no report or lab values available. Off Levothyroxine for years. - Check TSH, Free T4 today - Referral to Endocrinology for DM1 management, will forward lab results to Endocrinology as well for further management of hypothyroidism      Relevant Orders   TSH   T4, free   Ambulatory referral to Endocrinology   Chronic left-sided low back pain without sciatica    Subacute on chronic L LBP without associated sciatica. Suspect likely due to muscle spasm/strain, without known injury or trauma. Likely with prolonged standing at work, no known  underlying DJD or OA - No red flag symptoms. Negative SLR for radiculopathy - Inadequate conservative therapy  Plan: 1. Reviewed safe dosing Tylenol, up to 1g TID PRN or regular dosing 2. Agree hold NSAIDs for now, can use sparingly if needed 3. Heating pad, conservative therapy, and activity modification 4. Offered muscle relaxant Baclofen PRN, but declined for now, consider in future 5. Future consider Lumbar X-rays for baseline, given problem >1 year, some recent worsening, possible PT in future if needed       Other Visit Diagnoses    Encounter to establish care with new doctor    -  Primary   Relevant Orders   COMPLETE METABOLIC PANEL WITH GFR   Lipid panel   Screening for HIV (human immunodeficiency virus)       Relevant Orders   HIV antibody      Meds ordered this encounter  Medications  . insulin regular  (NOVOLIN R,HUMULIN R) 250 units/2.71mL (100 units/mL) injection    Sig: Inject into the skin daily. Pt uses as needed daily  . glucagon 1 MG injection    Sig: Use for Severe Hypoglycemia . Inject 1mg  intramuscularly if unresponsive, unable to swallow, unconscious and/or has seizure    Dispense:  1 each    Refill:  1    For questions regarding this prescription please call [phone].  Marland Kitchen acetone, urine, test strip    Sig: Check ketones as needed if concern for ketoacdisosis    Dispense:  20 each    Refill:  1      Follow up plan: Return in about 3 months (around 05/21/2016) for diabetes.  A total of 45 minutes was spent face-to-face with this patient. Greater than 50% of this time was spent in counseling and coordination of care with the patient, discussing future concerns with Type 1 Diabetes including disease progression, complications, treatment options, and referral plan, also contacted [facility] clinic to discuss patient with nurse triage to work on urgent referral.  Saralyn Pilar, DO [facility] 02/22/2016, 12:22 PM

## 2016-02-22 NOTE — Addendum Note (Signed)
Addended by: Smitty CordsKARAMALEGOS, ALEXANDER J on: 02/22/2016 12:22 PM   Modules accepted: Orders

## 2016-02-22 NOTE — Assessment & Plan Note (Signed)
Uncontrolled without any recent A1c in several years, limited by finances and prior lack of insurance coverage, self dosing OTC Novolin insulin with erratic meal schedule. Concern with infrequent CBG readings elevated >200-300, likely complication by history of some early DM neuropathy, and s/p bilateral cataract surgery. - Reassuring with infrequent hypoglycemia, but concern he does not have glucagon, no recent episodes of DKA or severe hypoglycemia  Plan: 1. Discussion today on Diabetes, progression, complications, management, he is aware of hypoglycemia plan 2. Rx Glucagon 1mg  pen for emergency use 3. Rx Acetone urine ketone test strips 4. Urgent Referral placed to Endocrinology [facility] [address] [facility]) to establish care for chronic management of T1DM, previously he was on insulin pump before losing insurance and history of better control on basal/bolus dosing, mutual agreement to discuss new treatment plan with Endocrine and no change today, but offered if needed we could transition him with some basal insulin if needed. [facility] to discuss case and notified them, they will anticipate referral and will try to work patient in sooner 5. Check basic labs, CMET, Lipids, A1c, TSH in setting of DM 6. Follow-up within 3 months as needed, return criteria reviewed

## 2016-02-22 NOTE — Assessment & Plan Note (Signed)
Suspected secondary hypothyroidism with hashimoto in setting of DM1, prior diagnosis, but no report or lab values available. Off Levothyroxine for years. - Check TSH, Free T4 today - Referral to Endocrinology for DM1 management, will forward lab results to Endocrinology as well for further management of hypothyroidism

## 2016-02-22 NOTE — Patient Instructions (Signed)
Thank you for coming in to clinic today.  1. Sent Glucagon and Ketone urine strips to use in an emergency if needed, as discussed to avoid hypoglycemia or hyperglycemia with possible DKA - Notify office if questions - Seek more immediate ED evaluation if severe hypoglycemia or concern for DKA with abdominal pain, nausea, vomiting, dehydration and any confusion  2. Ref[facility] today to [facility] - They will contact you with next appointment for new patient, should be soon, we will discuss with them and if need to start medication we will contact you before appointment  Otherwise continue current insulin regimen as you are  Recommend to start taking Tylenol Extra Strength 500mg  tabs - take 1 to 2 tabs per dose (max 1000mg ) every 6-8 hours for pain (take regularly, don't skip a dose for next 7 days), max 24 hour daily dose is 6 tablets or 3000mg . In the future you can repeat the same everyday Tylenol course for 1-2 weeks at a time.   In future we can check Back X-rays and discuss other therapy  Also, consider Gabapentin for diabetic neuropathy  Please schedule a follow-up appointment with Dr. Althea CharonKaramalegos in 3 months for DM  If you have any other questions or concerns, please feel free to call the clinic or send a message through MyChart. You may also schedule an earlier appointment if necessary.  Saralyn PilarAlexander Autrey Human, DO [facility]

## 2016-02-23 LAB — TSH: TSH: 3.62 mIU/L (ref 0.40–4.50)

## 2016-02-23 LAB — LIPID PANEL
Cholesterol: 193 mg/dL (ref <200)
HDL: 74 mg/dL (ref >40)
LDL CALC: 101 mg/dL — AB (ref <100)
TRIGLYCERIDES: 88 mg/dL (ref <150)
Total CHOL/HDL Ratio: 2.6 Ratio (ref <5.0)
VLDL: 18 mg/dL (ref <30)

## 2016-02-23 LAB — COMPLETE METABOLIC PANEL WITH GFR
ALT: 30 U/L (ref 9–46)
AST: 26 U/L (ref 10–40)
Albumin: 4.1 g/dL (ref 3.6–5.1)
Alkaline Phosphatase: 128 U/L — ABNORMAL HIGH (ref 40–115)
BILIRUBIN TOTAL: 0.4 mg/dL (ref 0.2–1.2)
BUN: 18 mg/dL (ref 7–25)
CHLORIDE: 98 mmol/L (ref 98–110)
CO2: 30 mmol/L (ref 20–31)
Calcium: 9.6 mg/dL (ref 8.6–10.3)
Creat: 0.99 mg/dL (ref 0.60–1.35)
Glucose, Bld: 258 mg/dL — ABNORMAL HIGH (ref 65–99)
Potassium: 3.9 mmol/L (ref 3.5–5.3)
Sodium: 136 mmol/L (ref 135–146)
Total Protein: 7.8 g/dL (ref 6.1–8.1)

## 2016-02-23 LAB — HIV ANTIBODY (ROUTINE TESTING W REFLEX): HIV 1&2 Ab, 4th Generation: NONREACTIVE

## 2016-02-23 LAB — T4, FREE: Free T4: 0.9 ng/dL (ref 0.8–1.8)

## 2016-02-23 LAB — HEMOGLOBIN A1C
Hgb A1c MFr Bld: 9.3 % — ABNORMAL HIGH (ref <5.7)
Mean Plasma Glucose: 220 mg/dL

## 2016-04-04 IMAGING — CR DG CHEST 2V
1 series · 2 of 2 positions shown · non-contrast
Comparison: One-view chest x-ray 04/21/2005.

CLINICAL DATA: Shortness breath. Cough and chest tightness for 3
days. Vomiting and diarrhea.

EXAM:
CHEST - 2 VIEW

[Series 1: w chest pa · 0.14mm/px · 2 of 2 slices shown]
[im 1/2]
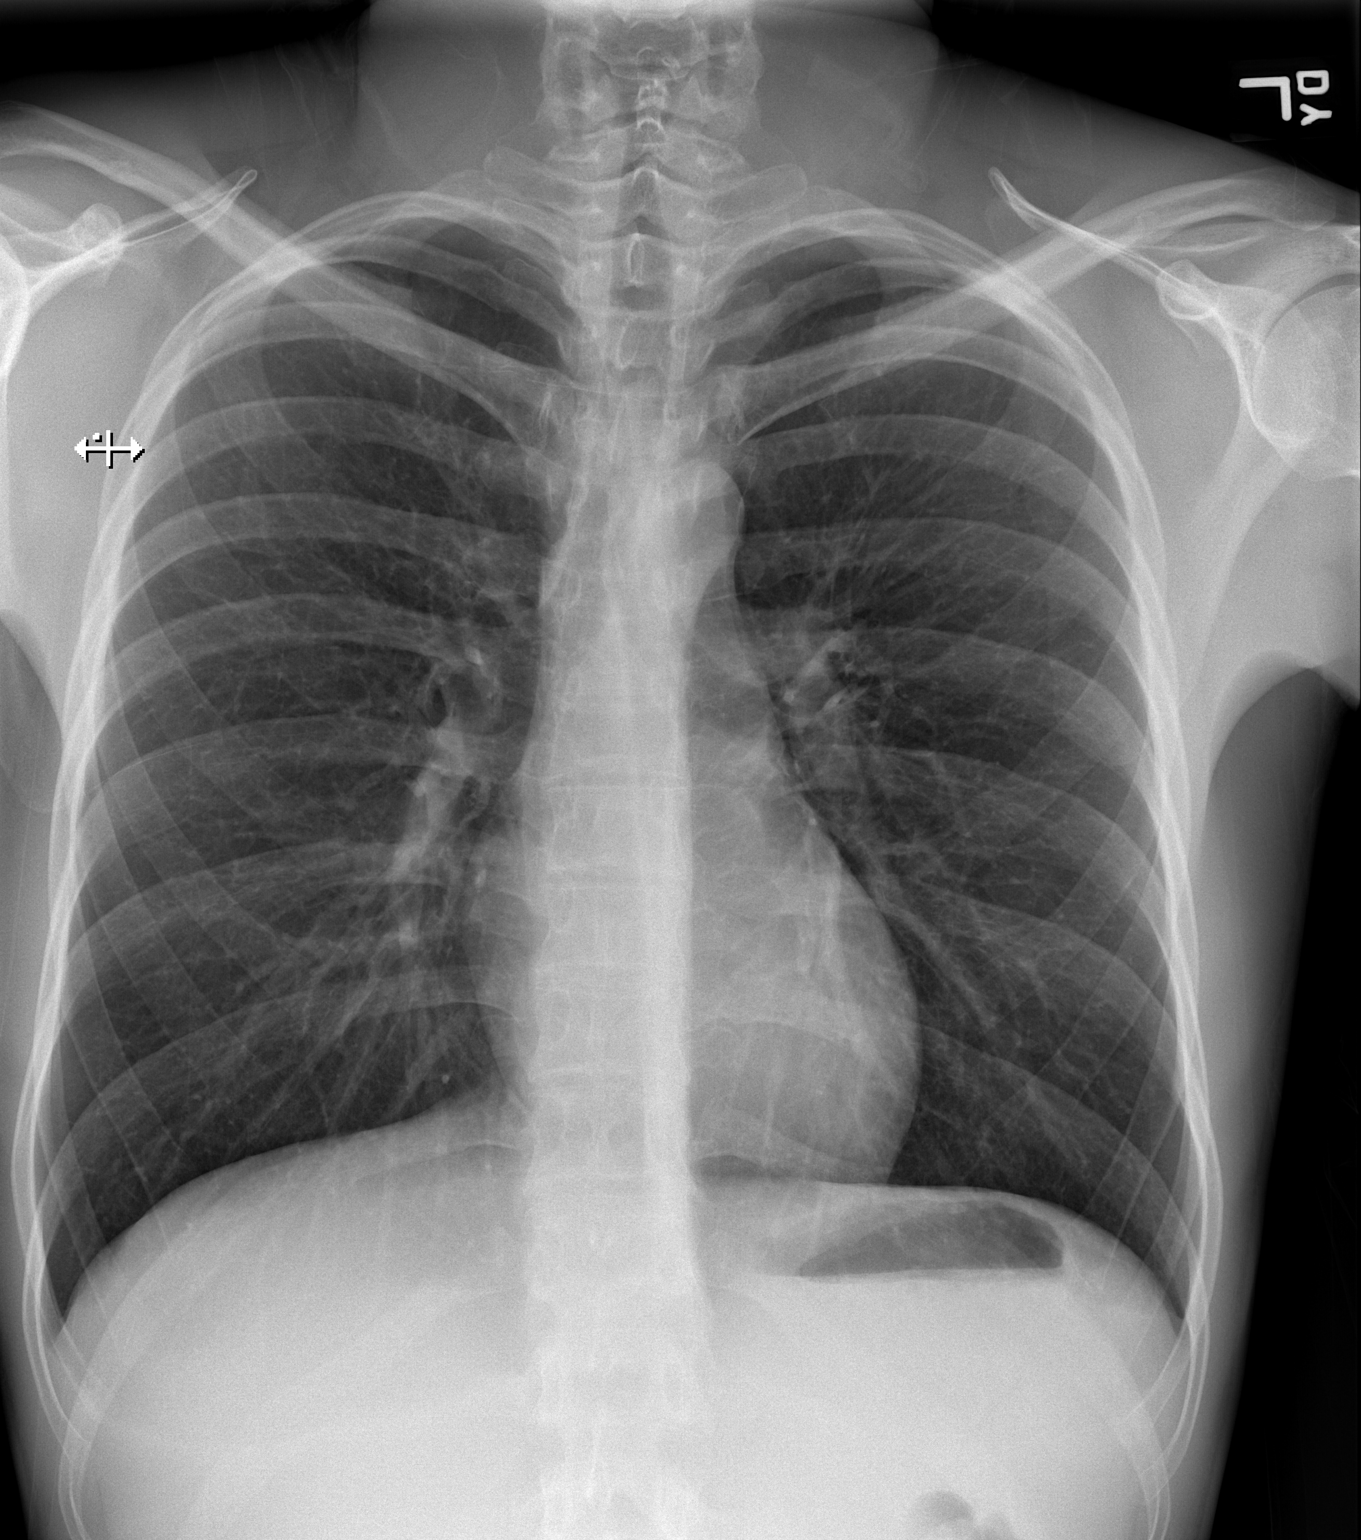
[im 2/2]
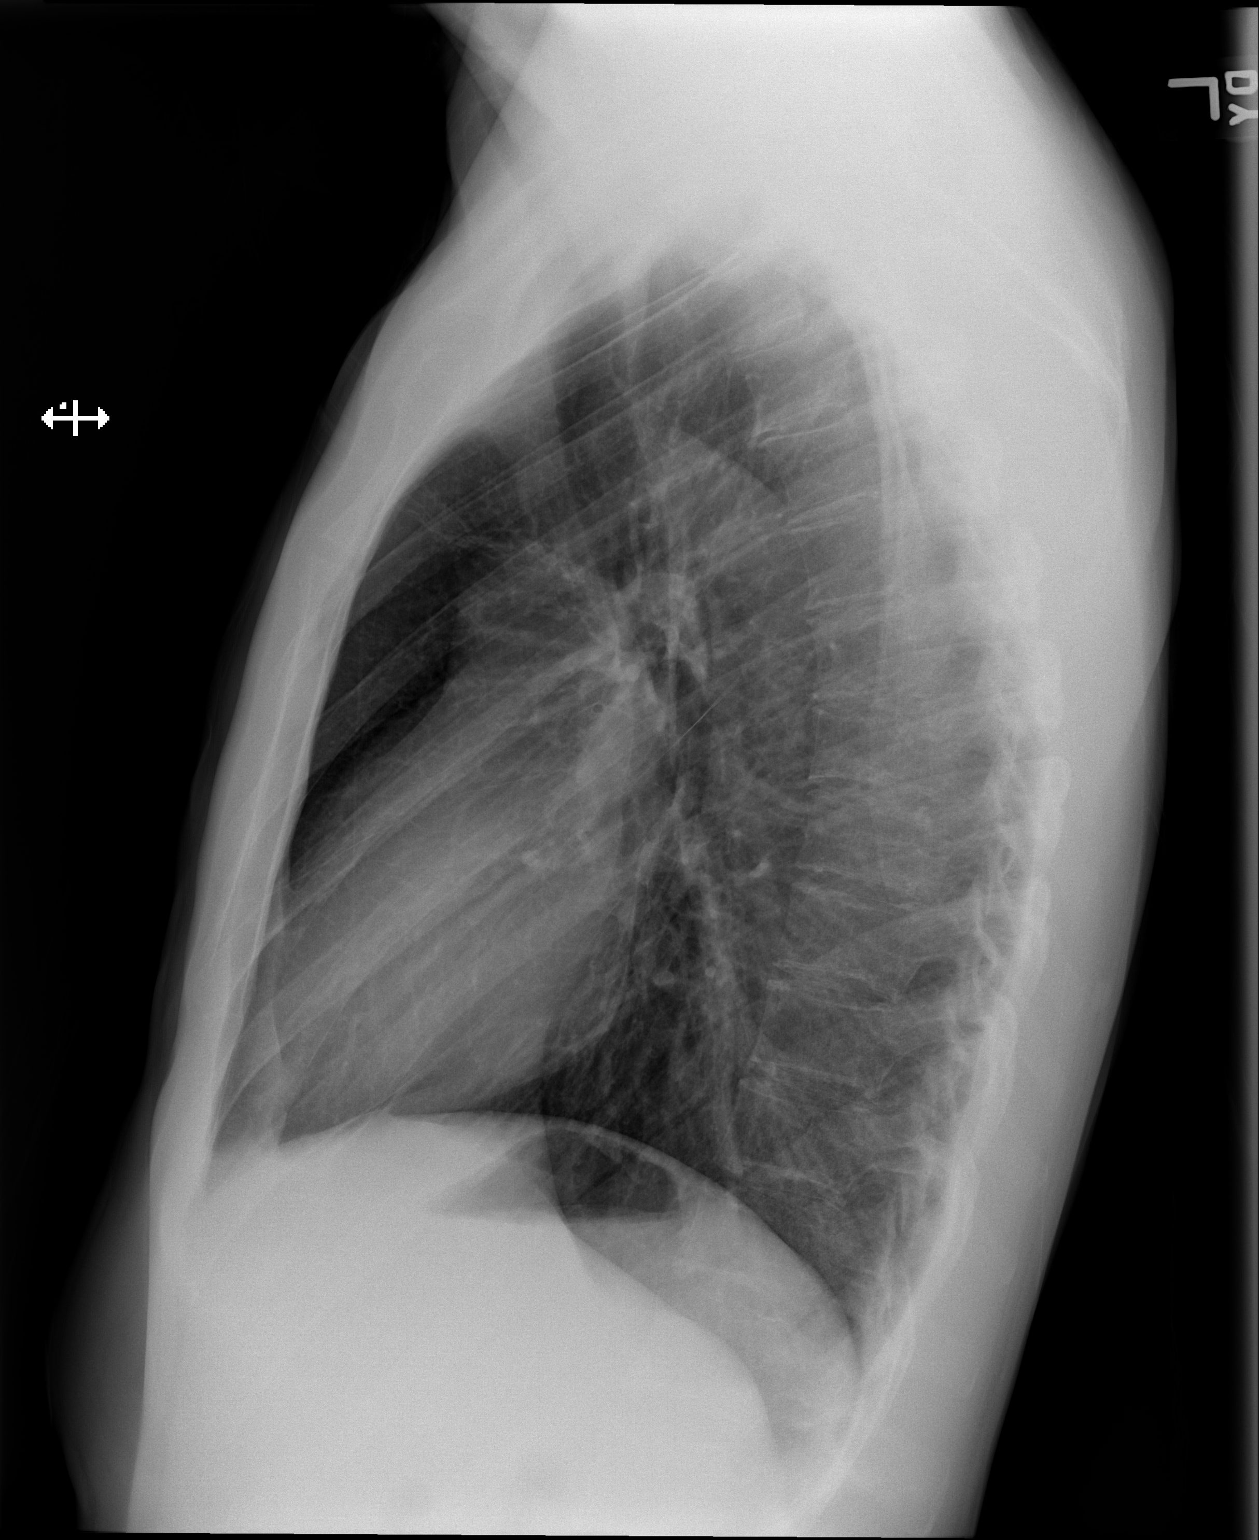

[2 of 2 positions shown; findings below may reference images not displayed]

FINDINGS: The heart size and mediastinal contours are within normal limits.
Both lungs are clear. The visualized skeletal structures are
unremarkable.
IMPRESSION: Negative two view chest x-ray

## 2016-05-10 IMAGING — US US SCROTUM
1 series · 13 of 25 positions shown · non-contrast
Comparison: None.

CLINICAL DATA: 24-year-old who sustained an injury to the scrotum 6
days ago and complains of continued pain and tenderness.

EXAM:
SCROTAL ULTRASOUND
DOPPLER ULTRASOUND OF THE TESTICLES
TECHNIQUE: Complete ultrasound examination of the testicles, epididymis, and
other scrotal structures was performed. Color and spectral Doppler
ultrasound were also utilized to evaluate blood flow to the
testicles.

[Series 1: us scrotum · 0.07mm/px · 13 of 49 slices shown]
[im 1/49]
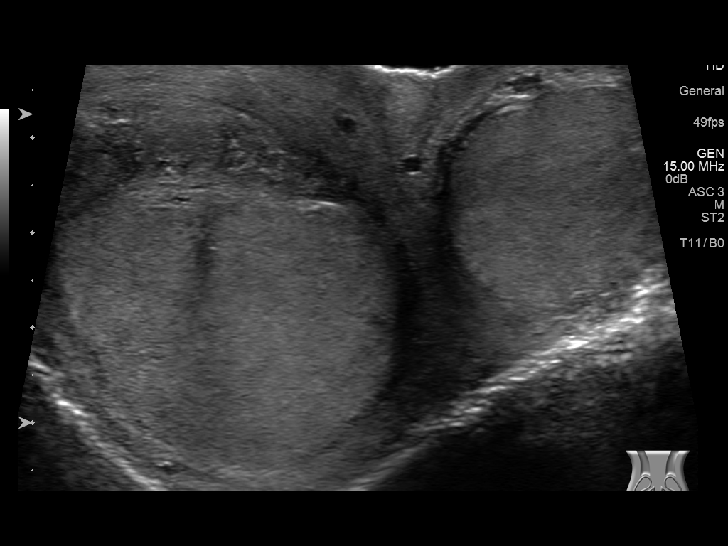
[im 5/49]
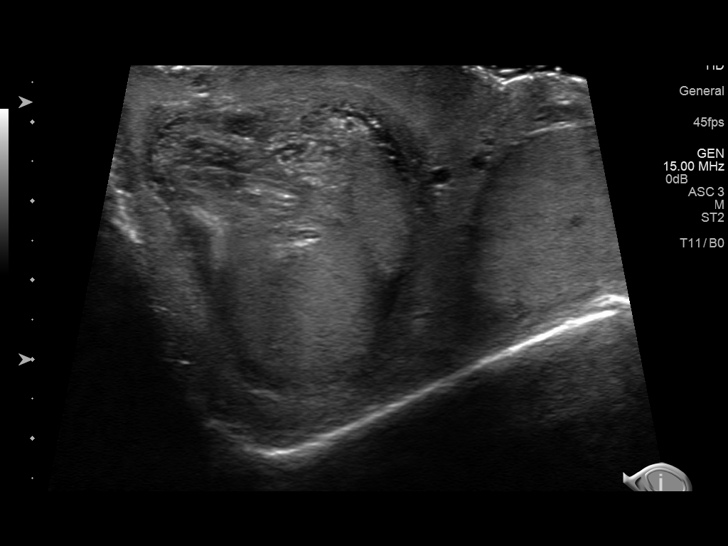
[im 9/49]
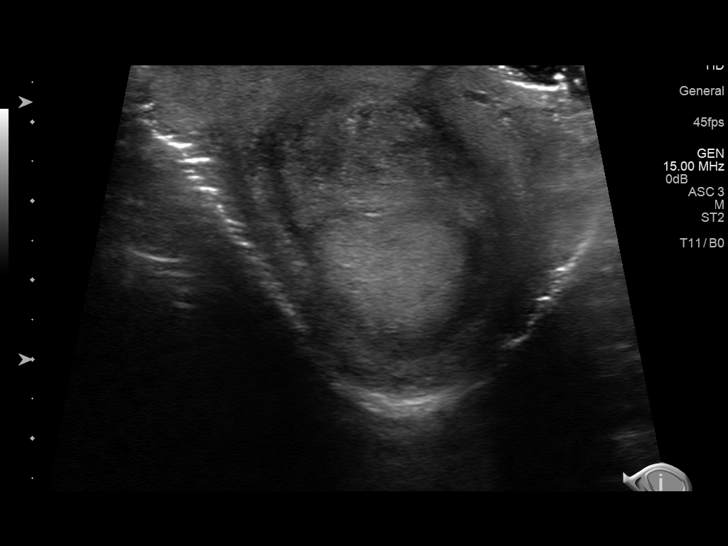
[im 13/49]
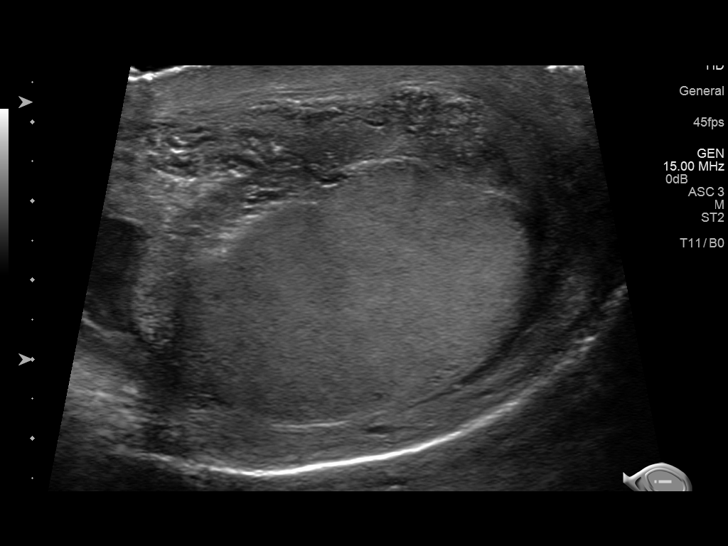
[im 17/49]
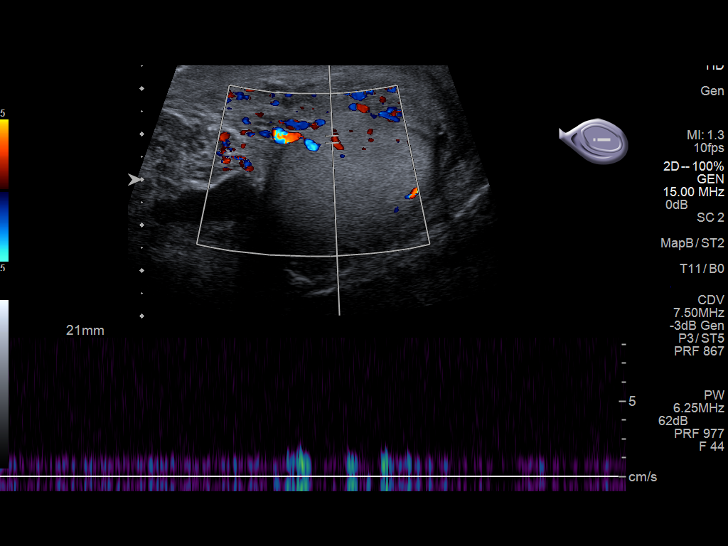
[im 21/49]
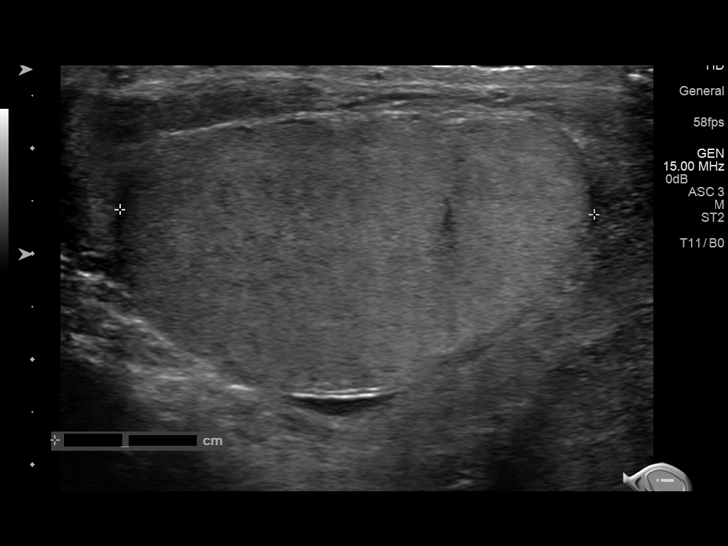
[im 25/49]
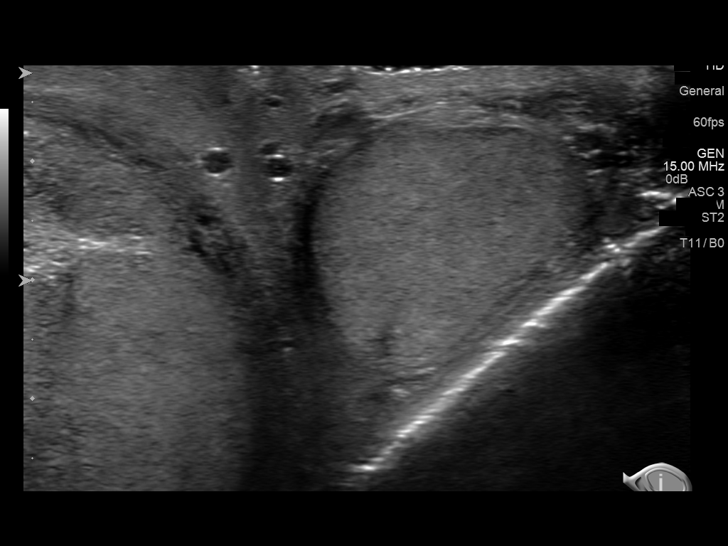
[im 29/49]
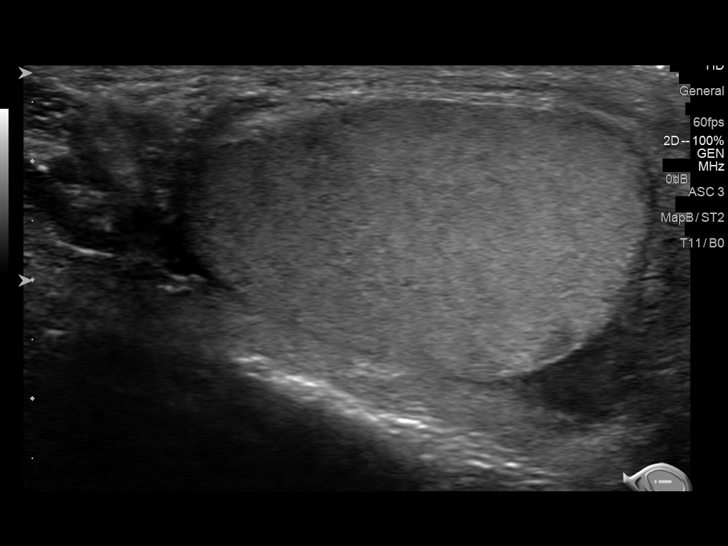
[im 33/49]
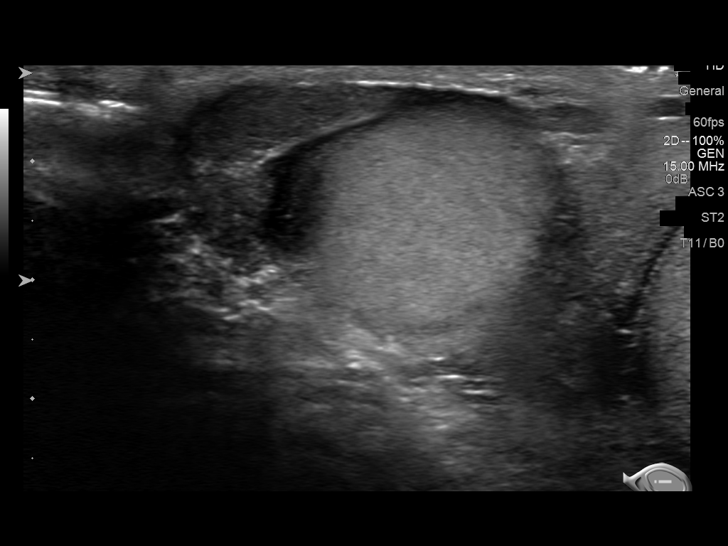
[im 37/49]
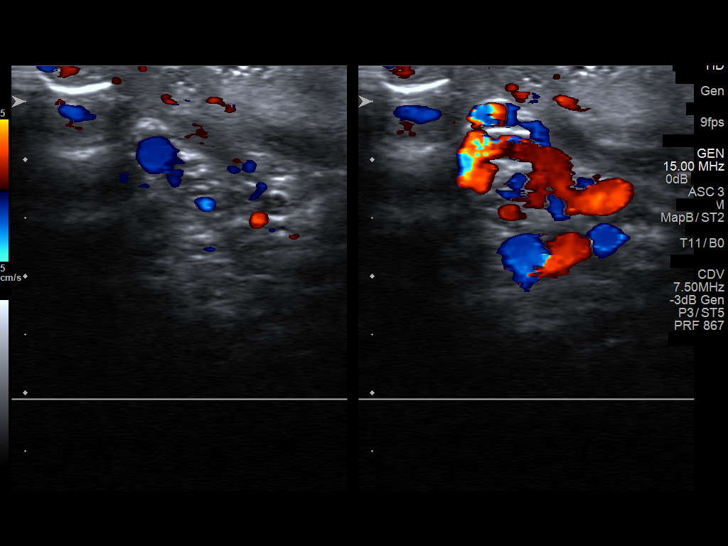
[im 41/49]
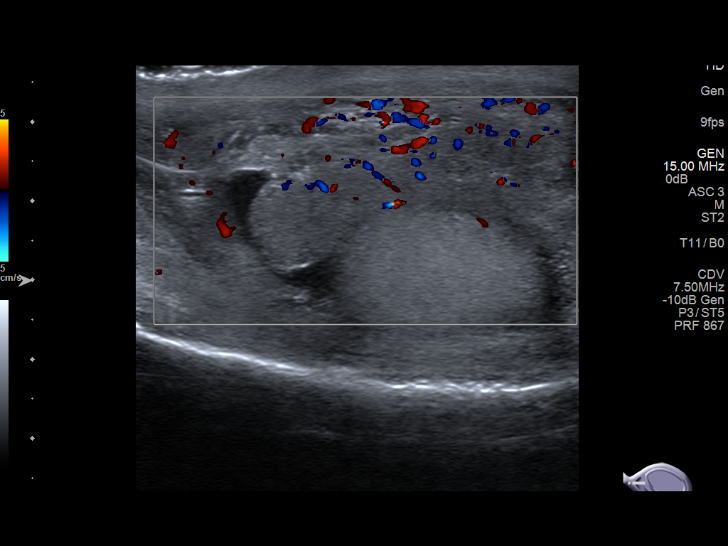
[im 45/49]
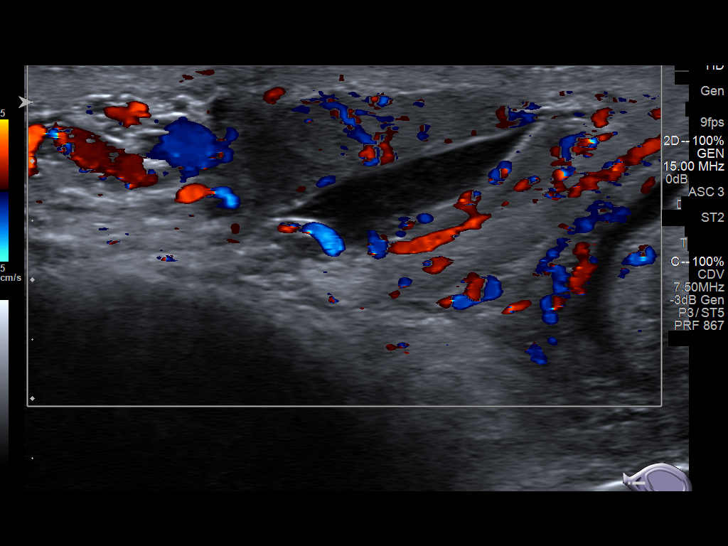
[im 49/49]
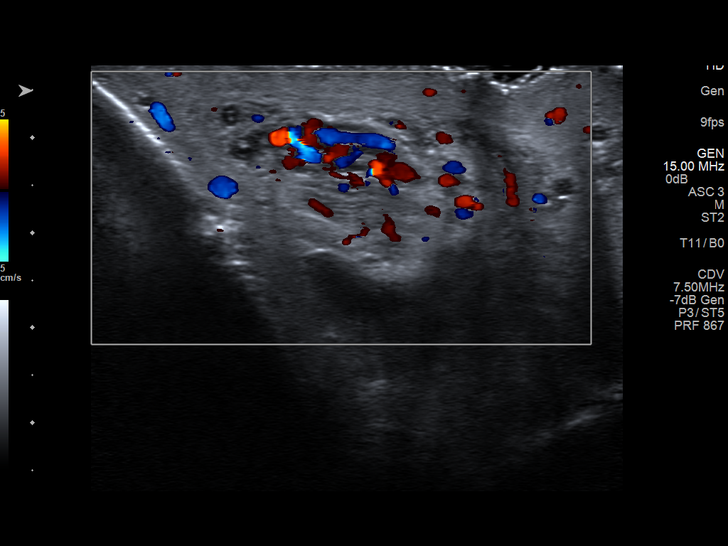

[13 of 25 positions shown; findings below may reference images not displayed]

FINDINGS: Right testicle

Measurements: Approximately 4.4 x 3.0 x 3.2 cm. Normal parenchymal
echotexture without mass or microlithiasis. No evidence of acute or
subacute injury. Normal color Doppler flow without evidence of
hyperemia.

Left testicle

Measurements: Approximately 4.5 x 2.3 x 3.0 cm. Normal parenchymal
echotexture without mass or microlithiasis. No evidence of acute or
subacute injury. Normal color Doppler flow without evidence of
hyperemia.

Right epididymis:  Normal in size and appearance.

Left epididymis:  Normal in size and appearance.

Hydrocele:  None visualized.

Varicocele: Bilateral varicoceles, right greater than left, with
prominent visible pampiniform plexus superior to the right testicle.

Pulsed Doppler interrogation of both testes demonstrates normal low
resistance arterial and venous waveforms bilaterally.
IMPRESSION: 1. No evidence of acute or subacute testicular injury.
2. No evidence of testicular torsion.
3. No evidence of epididymo-orchitis.
4. Bilateral varicoceles, right greater than left, with prominent
visible pampiniform plexus superior to the right testicle.

## 2016-05-23 ENCOUNTER — Ambulatory Visit: Payer: BLUE CROSS/BLUE SHIELD | Admitting: Family Medicine

## 2021-09-16 ENCOUNTER — Emergency Department
Admission: EM | Admit: 2021-09-16 | Discharge: 2021-09-16 | Disposition: A | Discharge status: Home / Self Care | Payer: Self-pay | Attending: Physician Assistant | Admitting: Physician Assistant

## 2021-09-16 ENCOUNTER — Encounter: Payer: Self-pay | Admitting: Emergency Medicine

## 2021-09-16 ENCOUNTER — Other Ambulatory Visit: Payer: Self-pay

## 2021-09-16 ENCOUNTER — Emergency Department: Payer: Self-pay

## 2021-09-16 DIAGNOSIS — E039 Hypothyroidism, unspecified: Secondary | ICD-10-CM | POA: Insufficient documentation

## 2021-09-16 DIAGNOSIS — W208XXA Other cause of strike by thrown, projected or falling object, initial encounter: Secondary | ICD-10-CM | POA: Insufficient documentation

## 2021-09-16 DIAGNOSIS — Y990 Civilian activity done for income or pay: Secondary | ICD-10-CM | POA: Insufficient documentation

## 2021-09-16 DIAGNOSIS — E109 Type 1 diabetes mellitus without complications: Secondary | ICD-10-CM | POA: Insufficient documentation

## 2021-09-16 DIAGNOSIS — M7042 Prepatellar bursitis, left knee: Secondary | ICD-10-CM | POA: Insufficient documentation

## 2021-09-16 DIAGNOSIS — Y9389 Activity, other specified: Secondary | ICD-10-CM | POA: Insufficient documentation

## 2021-09-16 MED ORDER — NAPROXEN 500 MG PO TABS: 500.0000 mg | ORAL_TABLET | Freq: Two times a day (BID) | ORAL | 0 refills | Status: AC

## 2021-09-16 NOTE — ED Notes (Signed)
Pt alert and sitting calmly on stretcher. Stretcher locked low.

## 2021-09-16 NOTE — Discharge Instructions (Addendum)
Your exam and x-ray are consistent with a traumatic bursitis to the left knee.  Take the prescription anti-inflammatory as directed.  Rest, ice, elevate the knee as prescribed.  The knee sleeve with all work activities.  You should follow-up with orthopedics for ongoing evaluation and management.

## 2021-09-16 NOTE — ED Notes (Addendum)
Imaging staff at bedside.  

## 2021-09-16 NOTE — ED Provider Notes (Signed)
[facility] Emergency Department Provider Note     None    (approximate)   History   Knee Pain and Knee Injury   HPI  Hector Marsh is a 31 y.o. male with a history of type I DM, hypothyroidism and left-sided sciatica who presents to the ED with intermittent left knee swelling.  Patient reports work-related injury approximately 4 weeks prior, where a large vehicle break caliper fell hitting him on the knee.  He denies any evaluation at that time.  Patient is been managing his symptoms intermittently with ice therapy and anti-inflammatories.  Denies any history of chronic ongoing knee pain.  He denies any other injury related to the incident.  This is his initial evaluation for this complaint.     Physical Exam   Triage Vital Signs: ED Triage Vitals  Enc Vitals Group     BP 09/16/21 0856 (!) 138/99     Pulse Rate 09/16/21 0856 68     Resp 09/16/21 0856 17     Temp 09/16/21 0856 97.7 F (36.5 C)     Temp Source 09/16/21 0856 Oral     SpO2 09/16/21 0856 98 %     Weight 09/16/21 0851 165 lb (74.8 kg)     Height 09/16/21 0851 6' (1.829 m)     Head Circumference --      Peak Flow --      Pain Score 09/16/21 0851 0     Pain Loc --      Pain Edu? --      Excl. in GC? --     Most recent vital signs: Vitals:   09/16/21 1116 09/16/21 1255  BP: (!) 136/100 (!) 130/95  Pulse: 74 80  Resp: 16 17  Temp:  97.9 F (36.6 C)  SpO2: 100% 99%    General Awake, no distress. NAD CV:  Good peripheral perfusion.  RESP:  Normal effort.  ABD:  No distention.  MSK:  Left knee without obvious deformity or dislocation.  Patient does have a large prepatellar bursitis noted to the left knee.  No overlying skin abrasion, erythema, warmth, or induration.  Normal flexion and extension range to the left knee.   ED Results / Procedures / Treatments   Labs (all labs ordered are listed, but only abnormal results are displayed) Labs Reviewed - No data to  display   EKG   RADIOLOGY  I personally viewed and evaluated these images as part of my medical decision making, as well as reviewing the written report by the radiologist.  ED Provider Interpretation: no acute fracture}  DG Knee Complete 4 Views Left  Result Date: 09/16/2021 CLINICAL DATA:  Larey Seat 4 weeks ago. EXAM: LEFT KNEE - COMPLETE 4+ VIEW COMPARISON:  None Available. FINDINGS: Marked soft tissue swelling anterior to the patella. No fracture, dislocation, or joint effusion. IMPRESSION: Focal soft tissue swelling anterior to the patella suggests bursitis. No fracture or joint effusion. Electronically Signed   By: Gerome Sam III M.D.   On: 09/16/2021 12:13     PROCEDURES:  Critical Care performed: No  Procedures   MEDICATIONS ORDERED IN ED: Medications - No data to display   IMPRESSION / MDM / ASSESSMENT AND PLAN / ED COURSE  I reviewed the triage vital signs and the nursing notes.                              Differential diagnosis  includes, but is not limited to, Patella bursitis, knee contusion, knee sprain, patella fracture, cellulitis, septic joint  Patient's presentation is most consistent with acute complicated illness / injury requiring diagnostic workup.  Patient to the ED for evaluation of acute soft tissue swelling to the left knee.  Patient reports recent traumatic contusion to the anterior knee.  Patient's diagnosis is consistent with a prepatellar bursitis.  Rheologic evidence of any acute fracture or dislocation.  No concern for overlying cellulitis, abscess, or septic joint.  Patient will be discharged home with prescriptions for naproxen and a soft knee sleeve, as well as first-aid measures to help alleviate swelling and disability. Patient is to follow up with Ortho as needed or otherwise directed. Patient is given ED precautions to return to the ED for any worsening or new symptoms.     FINAL CLINICAL IMPRESSION(S) / ED DIAGNOSES   Final diagnoses:   Prepatellar bursitis of left knee     Rx / DC Orders   ED Discharge Orders          Ordered    naproxen (NAPROSYN) 500 MG tablet  2 times daily with meals        09/16/21 1243             Note:  This document was prepared using Dragon voice recognition software and may include unintentional dictation errors.    Lissa Hoard, PA-C 09/16/21 1641    Pilar Jarvis, MD 09/16/21 2007

## 2021-09-16 NOTE — ED Triage Notes (Signed)
Pt reports fell off a car 4 weeks ago at work and hurt his left knee. Pt reports no pain at this time but intermittently it will hurt or swell so he wants it checked out.

## 2021-09-16 NOTE — ED Notes (Signed)
See triage note. Pt reports heavy piece of equipment from car fell at work and hit his L knee within last month; states sometimes knee feels hard and other times soft; reports no pain at knee currently; obvious fluid/swelling noted directly over patella at L knee; Pt denies fever. L knee appropriate in color and warmth. Pt can bend L knee as needed. Pt states has tried using ice and ibuprofen at home without full relief of swelling. No bruising noted at L knee. Pt in NAD; pt reports his boss is concerned and made him come get checked out; pt requesting work note for when he leaves.
